# Patient Record
Sex: Female | Born: 1950 | Race: White | Hispanic: No | Marital: Married | Smoking: Never smoker
Health system: Southern US, Community
[De-identification: ages and names within clinical notes are randomized; demographics above are authoritative.]

## PROBLEM LIST (undated history)

## (undated) DIAGNOSIS — I10 Essential (primary) hypertension: Secondary | ICD-10-CM

## (undated) DIAGNOSIS — C4491 Basal cell carcinoma of skin, unspecified: Secondary | ICD-10-CM

## (undated) DIAGNOSIS — D122 Benign neoplasm of ascending colon: Secondary | ICD-10-CM

## (undated) DIAGNOSIS — Z8489 Family history of other specified conditions: Secondary | ICD-10-CM

## (undated) DIAGNOSIS — E7800 Pure hypercholesterolemia, unspecified: Secondary | ICD-10-CM

## (undated) DIAGNOSIS — R112 Nausea with vomiting, unspecified: Secondary | ICD-10-CM

## (undated) DIAGNOSIS — K219 Gastro-esophageal reflux disease without esophagitis: Secondary | ICD-10-CM

## (undated) DIAGNOSIS — R42 Dizziness and giddiness: Secondary | ICD-10-CM

## (undated) DIAGNOSIS — C4492 Squamous cell carcinoma of skin, unspecified: Secondary | ICD-10-CM

## (undated) DIAGNOSIS — M8580 Other specified disorders of bone density and structure, unspecified site: Secondary | ICD-10-CM

## (undated) DIAGNOSIS — Z9889 Other specified postprocedural states: Secondary | ICD-10-CM

## (undated) DIAGNOSIS — M1990 Unspecified osteoarthritis, unspecified site: Secondary | ICD-10-CM

## (undated) DIAGNOSIS — C801 Malignant (primary) neoplasm, unspecified: Secondary | ICD-10-CM

## (undated) HISTORY — PX: OTHER SURGICAL HISTORY: SHX169

## (undated) HISTORY — PX: BREAST CYST ASPIRATION: SHX578

## (undated) HISTORY — PX: ABDOMINAL HYSTERECTOMY: SHX81

---
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
## 2012-04-11 ENCOUNTER — Ambulatory Visit: Payer: Self-pay

## 2012-04-11 LAB — COMPREHENSIVE METABOLIC PANEL
Albumin: 4 g/dL (ref 3.4–5.0)
Alkaline Phosphatase: 111 U/L (ref 50–136)
Anion Gap: 9 (ref 7–16)
BUN: 27 mg/dL — ABNORMAL HIGH (ref 7–18)
Bilirubin,Total: 0.3 mg/dL (ref 0.2–1.0)
Calcium, Total: 9.5 mg/dL (ref 8.5–10.1)
Chloride: 103 mmol/L (ref 98–107)
Co2: 29 mmol/L (ref 21–32)
Creatinine: 0.99 mg/dL (ref 0.60–1.30)
EGFR (African American): >60
EGFR (Non-African Amer.): >60
Glucose: 106 mg/dL — ABNORMAL HIGH (ref 65–99)
Osmolality: 287 (ref 275–301)
Potassium: 4.8 mmol/L (ref 3.5–5.1)
SGOT(AST): 17 U/L (ref 15–37)
SGPT (ALT): 29 U/L (ref 12–78)
Sodium: 141 mmol/L (ref 136–145)
Total Protein: 7 g/dL (ref 6.4–8.2)

## 2012-04-11 LAB — CBC WITH DIFFERENTIAL/PLATELET
Basophil #: 0 10*3/uL (ref 0.0–0.1)
Basophil %: 0.6 %
Eosinophil #: 0.3 10*3/uL (ref 0.0–0.7)
Eosinophil %: 4.6 %
HCT: 37 % (ref 35.0–47.0)
HGB: 12 g/dL (ref 12.0–16.0)
Lymphocyte #: 2.1 10*3/uL (ref 1.0–3.6)
Lymphocyte %: 31.9 %
MCH: 27.6 pg (ref 26.0–34.0)
MCHC: 32.5 g/dL (ref 32.0–36.0)
MCV: 85 fL (ref 80–100)
Monocyte #: 0.4 x10 3/mm (ref 0.2–0.9)
Monocyte %: 6.7 %
Neutrophil #: 3.7 10*3/uL (ref 1.4–6.5)
Neutrophil %: 56.2 %
Platelet: 248 10*3/uL (ref 150–440)
RBC: 4.36 10*6/uL (ref 3.80–5.20)
RDW: 14.8 % — ABNORMAL HIGH (ref 11.5–14.5)
WBC: 6.5 10*3/uL (ref 3.6–11.0)

## 2012-04-11 LAB — LIPID PANEL
Cholesterol: 204 mg/dL — ABNORMAL HIGH (ref 0–200)
HDL Cholesterol: 49 mg/dL (ref 40–60)
Ldl Cholesterol, Calc: 130 mg/dL — ABNORMAL HIGH (ref 0–100)
Triglycerides: 125 mg/dL (ref 0–200)
VLDL Cholesterol, Calc: 25 mg/dL (ref 5–40)

## 2012-04-11 LAB — TSH: Thyroid Stimulating Horm: 2.02 u[IU]/mL

## 2014-01-05 DIAGNOSIS — K219 Gastro-esophageal reflux disease without esophagitis: Secondary | ICD-10-CM | POA: Insufficient documentation

## 2014-01-05 DIAGNOSIS — I10 Essential (primary) hypertension: Secondary | ICD-10-CM | POA: Insufficient documentation

## 2014-01-05 DIAGNOSIS — E7800 Pure hypercholesterolemia, unspecified: Secondary | ICD-10-CM | POA: Insufficient documentation

## 2015-01-12 ENCOUNTER — Other Ambulatory Visit: Payer: Self-pay | Admitting: Internal Medicine

## 2015-01-12 ENCOUNTER — Ambulatory Visit
Admission: RE | Admit: 2015-01-12 | Discharge: 2015-01-12 | Disposition: A | Discharge status: Home / Self Care | Payer: Medicare Other | Source: Ambulatory Visit | Attending: Internal Medicine | Admitting: Internal Medicine

## 2015-01-12 DIAGNOSIS — Z1231 Encounter for screening mammogram for malignant neoplasm of breast: Secondary | ICD-10-CM

## 2015-02-21 DIAGNOSIS — D122 Benign neoplasm of ascending colon: Secondary | ICD-10-CM

## 2015-02-21 DIAGNOSIS — M8580 Other specified disorders of bone density and structure, unspecified site: Secondary | ICD-10-CM | POA: Insufficient documentation

## 2015-02-21 HISTORY — DX: Benign neoplasm of ascending colon: D12.2

## 2015-02-24 ENCOUNTER — Other Ambulatory Visit: Payer: Self-pay | Admitting: Internal Medicine

## 2015-02-24 DIAGNOSIS — R319 Hematuria, unspecified: Secondary | ICD-10-CM

## 2015-02-24 DIAGNOSIS — R102 Pelvic and perineal pain: Secondary | ICD-10-CM

## 2015-03-01 ENCOUNTER — Ambulatory Visit: Admission: RE | Admit: 2015-03-01 | Payer: Medicare Other | Source: Ambulatory Visit

## 2015-03-01 ENCOUNTER — Ambulatory Visit
Admission: RE | Admit: 2015-03-01 | Discharge: 2015-03-01 | Disposition: A | Discharge status: Home / Self Care | Payer: Medicare Other | Source: Ambulatory Visit | Attending: Internal Medicine | Admitting: Internal Medicine

## 2015-03-01 DIAGNOSIS — Z90710 Acquired absence of both cervix and uterus: Secondary | ICD-10-CM | POA: Diagnosis not present

## 2015-03-01 DIAGNOSIS — R102 Pelvic and perineal pain: Secondary | ICD-10-CM

## 2015-03-01 DIAGNOSIS — R319 Hematuria, unspecified: Secondary | ICD-10-CM | POA: Insufficient documentation

## 2015-12-14 ENCOUNTER — Other Ambulatory Visit: Payer: Self-pay | Admitting: Internal Medicine

## 2015-12-14 DIAGNOSIS — Z1231 Encounter for screening mammogram for malignant neoplasm of breast: Secondary | ICD-10-CM

## 2016-01-16 ENCOUNTER — Ambulatory Visit: Payer: Medicare Other

## 2016-01-16 ENCOUNTER — Ambulatory Visit
Admission: RE | Admit: 2016-01-16 | Discharge: 2016-01-16 | Disposition: A | Discharge status: Home / Self Care | Payer: Medicare Other | Source: Ambulatory Visit | Attending: Internal Medicine | Admitting: Internal Medicine

## 2016-01-16 DIAGNOSIS — Z1231 Encounter for screening mammogram for malignant neoplasm of breast: Secondary | ICD-10-CM | POA: Diagnosis present

## 2016-01-16 HISTORY — DX: Malignant (primary) neoplasm, unspecified: C80.1

## 2016-07-02 ENCOUNTER — Encounter: Payer: Self-pay | Admitting: *Deleted

## 2016-07-03 ENCOUNTER — Ambulatory Visit
Admission: RE | Admit: 2016-07-03 | Discharge: 2016-07-03 | Disposition: A | Discharge status: Home / Self Care | Payer: Medicare HMO | Source: Ambulatory Visit | Attending: Gastroenterology | Admitting: Gastroenterology

## 2016-07-03 ENCOUNTER — Ambulatory Visit: Payer: Medicare HMO | Admitting: Anesthesiology

## 2016-07-03 ENCOUNTER — Encounter
Admission: RE | Disposition: A | Discharge status: Home / Self Care | Payer: Self-pay | Source: Ambulatory Visit | Attending: Gastroenterology

## 2016-07-03 ENCOUNTER — Encounter: Payer: Self-pay | Admitting: *Deleted

## 2016-07-03 DIAGNOSIS — K219 Gastro-esophageal reflux disease without esophagitis: Secondary | ICD-10-CM | POA: Diagnosis not present

## 2016-07-03 DIAGNOSIS — Z86010 Personal history of colonic polyps: Secondary | ICD-10-CM | POA: Diagnosis not present

## 2016-07-03 DIAGNOSIS — Z79899 Other long term (current) drug therapy: Secondary | ICD-10-CM | POA: Insufficient documentation

## 2016-07-03 DIAGNOSIS — Z85828 Personal history of other malignant neoplasm of skin: Secondary | ICD-10-CM | POA: Diagnosis not present

## 2016-07-03 DIAGNOSIS — Z7982 Long term (current) use of aspirin: Secondary | ICD-10-CM | POA: Diagnosis not present

## 2016-07-03 DIAGNOSIS — Z1211 Encounter for screening for malignant neoplasm of colon: Secondary | ICD-10-CM | POA: Insufficient documentation

## 2016-07-03 DIAGNOSIS — Z882 Allergy status to sulfonamides status: Secondary | ICD-10-CM | POA: Diagnosis not present

## 2016-07-03 DIAGNOSIS — I10 Essential (primary) hypertension: Secondary | ICD-10-CM | POA: Insufficient documentation

## 2016-07-03 DIAGNOSIS — K5730 Diverticulosis of large intestine without perforation or abscess without bleeding: Secondary | ICD-10-CM | POA: Insufficient documentation

## 2016-07-03 HISTORY — DX: Other specified disorders of bone density and structure, unspecified site: M85.80

## 2016-07-03 HISTORY — PX: COLONOSCOPY WITH PROPOFOL: SHX5780

## 2016-07-03 HISTORY — DX: Gastro-esophageal reflux disease without esophagitis: K21.9

## 2016-07-03 HISTORY — DX: Pure hypercholesterolemia, unspecified: E78.00

## 2016-07-03 HISTORY — DX: Essential (primary) hypertension: I10

## 2016-07-03 SURGERY — COLONOSCOPY WITH PROPOFOL
Anesthesia: General

## 2016-07-03 MED ORDER — PROPOFOL 10 MG/ML IV BOLUS
INTRAVENOUS | Status: DC | PRN
  Administered 2016-07-03: 30 mg via INTRAVENOUS
  Administered 2016-07-03: 50 mg via INTRAVENOUS

## 2016-07-03 MED ORDER — SODIUM CHLORIDE 0.9 % IV SOLN
INTRAVENOUS | Status: DC
  Administered 2016-07-03: 1000 mL via INTRAVENOUS

## 2016-07-03 MED ORDER — SODIUM CHLORIDE 0.9 % IV SOLN: INTRAVENOUS | Status: DC

## 2016-07-03 MED ORDER — PROPOFOL 500 MG/50ML IV EMUL
INTRAVENOUS | Status: AC
  Filled 2016-07-03: qty 50

## 2016-07-03 MED ORDER — PROPOFOL 500 MG/50ML IV EMUL
INTRAVENOUS | Status: DC | PRN
  Administered 2016-07-03: 150 ug/kg/min via INTRAVENOUS

## 2016-07-03 NOTE — Anesthesia Postprocedure Evaluation (Signed)
Anesthesia Post Note  Patient: Tabitha Gonzales  Procedure(s) Performed: Procedure(s) (LRB): COLONOSCOPY WITH PROPOFOL (N/A)  Patient location during evaluation: Endoscopy Anesthesia Type: General Level of consciousness: awake and alert Pain management: pain level controlled Vital Signs Assessment: post-procedure vital signs reviewed and stable Respiratory status: spontaneous breathing and respiratory function stable Cardiovascular status: stable Anesthetic complications: no     Last Vitals:  Vitals:   07/03/16 1204 07/03/16 1214  BP: 104/63 103/79  Pulse: (!) 57 65  Resp: 15 14  Temp: 36.1 C     Last Pain:  Vitals:   07/03/16 1204  TempSrc: Tympanic                 KEPHART,WILLIAM K

## 2016-07-03 NOTE — H&P (Signed)
Outpatient short stay form Pre-procedure 07/03/2016 11:16 AM Tabitha Sails MD  Primary Physician: Dr. Fulton Reek  Reason for visit:  Colonoscopy  History of present illness:  Patient is a 66 year old female presenting as above. She has personal history of adenomatous colon polyps. She tolerated her prep well. She takes no blood thinning agents with the exception of a 81 mg aspirin that has been held for several days. Her last colonoscopy was 05/21/2013 with a 1 cm adenoma. She does have reflux symptoms that are well-controlled on Nexium daily.    Current Facility-Administered Medications:  .  0.9 %  sodium chloride infusion, , Intravenous, Continuous, Tabitha Sails, MD, Last Rate: 20 mL/hr at 07/03/16 1059, 1,000 mL at 07/03/16 1059 .  0.9 %  sodium chloride infusion, , Intravenous, Continuous, Tabitha Sails, MD  Prescriptions Prior to Admission  Medication Sig Dispense Refill Last Dose  . aspirin EC 81 MG tablet Take 81 mg by mouth daily.     Marland Kitchen esomeprazole (NEXIUM) 40 MG capsule Take 40 mg by mouth daily at 12 noon.     . fluticasone (FLONASE) 50 MCG/ACT nasal spray Place 1 spray into both nostrils daily.     . Multiple Vitamin (MULTIVITAMIN) tablet Take 1 tablet by mouth daily.     . polyethylene glycol powder (GLYCOLAX/MIRALAX) powder Take 1 Container by mouth once.     . [DISCONTINUED] Biotin 1000 MCG tablet Take 1,000 mcg by mouth 3 (three) times daily.     . [DISCONTINUED] estradiol (VIVELLE-DOT) 0.05 MG/24HR patch Place 1 patch onto the skin 2 (two) times a week.        Allergies  Allergen Reactions  . Sulfa Antibiotics Itching     Past Medical History:  Diagnosis Date  . Adenomatous polyp of ascending colon   . Cancer ([place])    skin ca  . GERD (gastroesophageal reflux disease)   . Hypercholesteremia   . Hypertension   . Osteopenia     Review of systems:      Physical Exam    Heart and lungs: Regular rate and rhythm without rub or gallop, lungs  are bilaterally clear.    HEENT: Normocephalic atraumatic eyes are anicteric    Other:     Pertinant exam for procedure: Soft nontender nondistended bowel sounds positive normoactive.    Planned proceedures: Colonoscopy Indicated procedures. I have discussed the risks benefits and complications of procedures to include not limited to bleeding, infection, perforation and the risk of sedation and the patient wishes to proceed.    Tabitha Sails, MD Gastroenterology 07/03/2016  11:16 AM

## 2016-07-03 NOTE — Transfer of Care (Signed)
Immediate Anesthesia Transfer of Care Note  Patient: Tabitha Gonzales  Procedure(s) Performed: Procedure(s): COLONOSCOPY WITH PROPOFOL (N/A)  Patient Location: PACU  Anesthesia Type:General  Level of Consciousness: sedated  Airway & Oxygen Therapy: Patient Spontanous Breathing and Patient connected to nasal cannula oxygen  Post-op Assessment: Report given to RN and Post -op Vital signs reviewed and stable  Post vital signs: Reviewed and stable  Last Vitals:  Vitals:   07/03/16 1035 07/03/16 1204  BP: (!) 144/86 104/63  Pulse: 75 (!) 57  Resp: 18 15  Temp: 36.3 C 36.1 C    Last Pain:  Vitals:   07/03/16 1204  TempSrc: Tympanic         Complications: No apparent anesthesia complications

## 2016-07-03 NOTE — Anesthesia Procedure Notes (Signed)
Date/Time: 07/03/2016 11:22 AM Performed by: Nelda Marseille Pre-anesthesia Checklist: Patient identified, Emergency Drugs available, Suction available, Patient being monitored and Timeout performed Oxygen Delivery Method: Nasal cannula

## 2016-07-03 NOTE — Anesthesia Post-op Follow-up Note (Cosign Needed)
Anesthesia QCDR form completed.        

## 2016-07-03 NOTE — Op Note (Signed)
[facility] Gastroenterology Patient Name: Tabitha Gonzales Procedure Date: 07/03/2016 11:08 AM MRN: [id number] Account #: [id number] Date of Birth: 1950/09/07 Admit Type: Outpatient Age: 66 Room: [facility] ENDO ROOM 3 Gender: Female Note Status: Finalized Procedure:            Colonoscopy Indications:          Personal history of colonic polyps Providers:            Lollie Sails, MD Referring MD:         Leonie Douglas. Doy Hutching, MD (Referring MD) Medicines:            Monitored Anesthesia Care Complications:        No immediate complications. Procedure:            Pre-Anesthesia Assessment:                       - ASA Grade Assessment: II - A patient with mild                        systemic disease.                       After obtaining informed consent, the colonoscope was                        passed under direct vision. Throughout the procedure,                        the patient's blood pressure, pulse, and oxygen                        saturations were monitored continuously. The                        Colonoscope was introduced through the anus and                        advanced to the the cecum, identified by appendiceal                        orifice and ileocecal valve. The colonoscopy was                        performed with moderate difficulty due to multiple                        diverticula in the colon and a tortuous colon.                        Successful completion of the procedure was aided by                        changing the patient to a supine position, changing the                        patient to a prone position, using manual pressure and                        withdrawing the scope and replacing with the pediatric  colonoscope. The patient tolerated the procedure well.                        The quality of the bowel preparation was good. Findings:      Many small and large-mouthed diverticula were found in the  sigmoid       colon, descending colon, transverse colon and ascending colon.      The digital rectal exam was normal. Impression:           - Diverticulosis in the sigmoid colon, in the                        descending colon, in the transverse colon and in the                        ascending colon.                       - No specimens collected. Recommendation:       - Discharge patient to home.                       - Repeat colonoscopy in 5 years for screening purposes. Procedure Code(s):    --- Professional ---                       [phone], Colonoscopy, flexible; diagnostic, including                        collection of specimen(s) by brushing or washing, when                        performed (separate procedure) Diagnosis Code(s):    --- Professional ---                       Z86.010, Personal history of colonic polyps                       K57.30, Diverticulosis of large intestine without                        perforation or abscess without bleeding CPT copyright 2016 American Medical Association. All rights reserved. The codes documented in this report are preliminary and upon coder review may  be revised to meet current compliance requirements. Lollie Sails, MD 07/03/2016 12:02:59 PM This report has been signed electronically. Number of Addenda: 0 Note Initiated On: 07/03/2016 11:08 AM Scope Withdrawal Time: 0 hours 7 minutes 33 seconds  Total Procedure Duration: 0 hours 23 minutes 25 seconds       [facility]

## 2016-07-03 NOTE — Anesthesia Preprocedure Evaluation (Signed)
Anesthesia Evaluation  Patient identified by MRN, date of birth, ID band Patient awake    Reviewed: Allergy & Precautions, NPO status , Patient's Chart, lab work & pertinent test results  History of Anesthesia Complications (+) PONVNegative for: history of anesthetic complications  Airway Mallampati: II       Dental   Pulmonary neg pulmonary ROS,           Cardiovascular      Neuro/Psych negative neurological ROS     GI/Hepatic Neg liver ROS, GERD  Medicated and Controlled,  Endo/Other  negative endocrine ROS  Renal/GU negative Renal ROS     Musculoskeletal   Abdominal   Peds  Hematology   Anesthesia Other Findings   Reproductive/Obstetrics                             Anesthesia Physical Anesthesia Plan  ASA: II  Anesthesia Plan: General   Post-op Pain Management:    Induction: Intravenous  PONV Risk Score and Plan: 4 or greater and Ondansetron, Dexamethasone, Propofol, Midazolam, Scopolamine patch - Pre-op and Treatment may vary due to age  Airway Management Planned:   Additional Equipment:   Intra-op Plan:   Post-operative Plan:   Informed Consent: I have reviewed the patients History and Physical, chart, labs and discussed the procedure including the risks, benefits and alternatives for the proposed anesthesia with the patient or authorized representative who has indicated his/her understanding and acceptance.     Plan Discussed with:   Anesthesia Plan Comments:         Anesthesia Quick Evaluation

## 2016-07-04 ENCOUNTER — Encounter: Payer: Self-pay | Admitting: Gastroenterology

## 2016-09-17 ENCOUNTER — Other Ambulatory Visit: Payer: Self-pay | Admitting: Internal Medicine

## 2016-09-17 DIAGNOSIS — R1011 Right upper quadrant pain: Secondary | ICD-10-CM

## 2016-09-21 ENCOUNTER — Ambulatory Visit
Admission: RE | Admit: 2016-09-21 | Discharge: 2016-09-21 | Disposition: A | Discharge status: Home / Self Care | Payer: Medicare HMO | Source: Ambulatory Visit | Attending: Internal Medicine | Admitting: Internal Medicine

## 2016-09-21 DIAGNOSIS — R1011 Right upper quadrant pain: Secondary | ICD-10-CM

## 2017-01-02 ENCOUNTER — Other Ambulatory Visit: Payer: Self-pay | Admitting: Internal Medicine

## 2017-01-02 DIAGNOSIS — Z1231 Encounter for screening mammogram for malignant neoplasm of breast: Secondary | ICD-10-CM

## 2017-01-21 IMAGING — MG MM DIGITAL SCREENING BILAT W/ TOMO W/ CAD
8 of 12 series · 8 of 28 positions shown · non-contrast
Comparison: None.

CLINICAL DATA: Screening.

EXAM:
DIGITAL SCREENING BILATERAL MAMMOGRAM WITH 3D TOMO WITH CAD

[L CC]
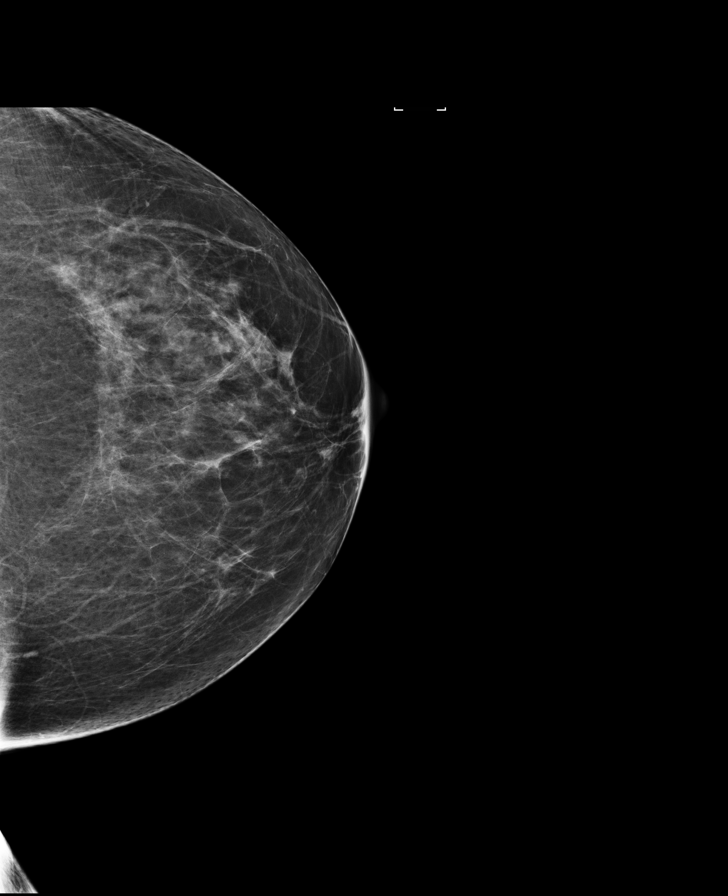

[L CC synth-2D]
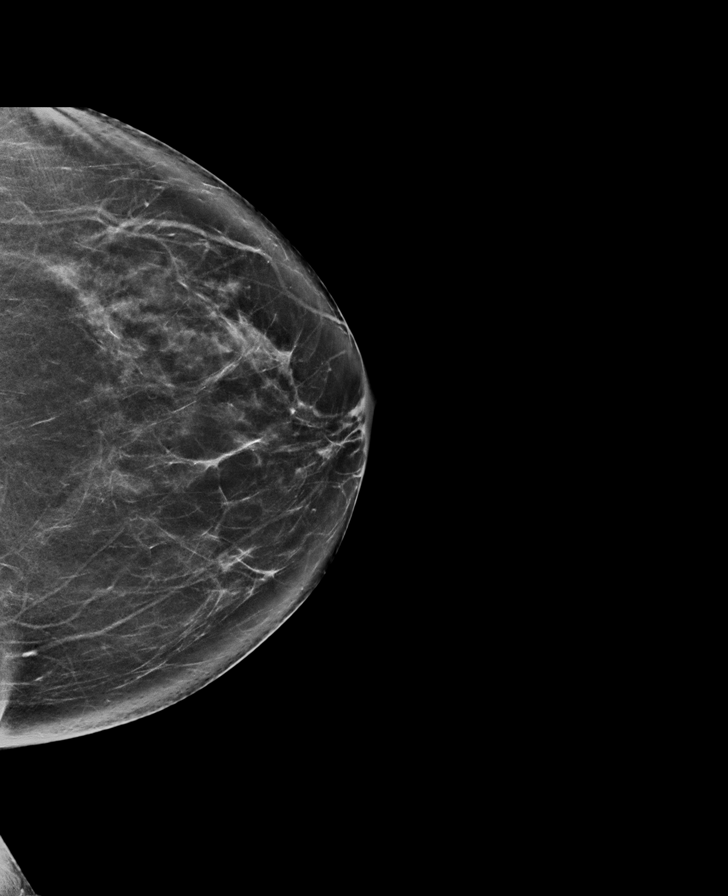

[R CC]
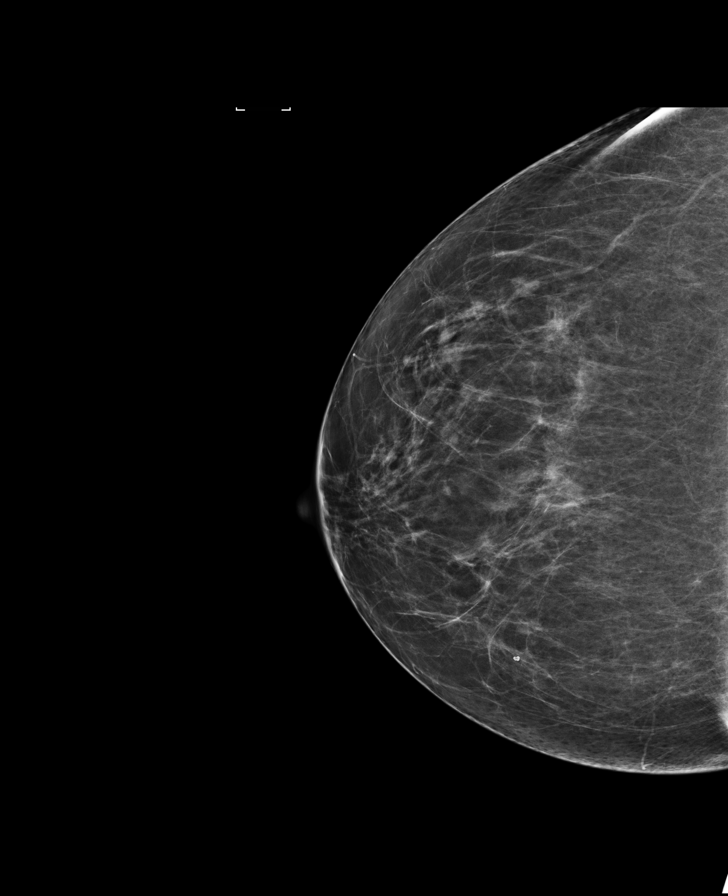

[R CC synth-2D]
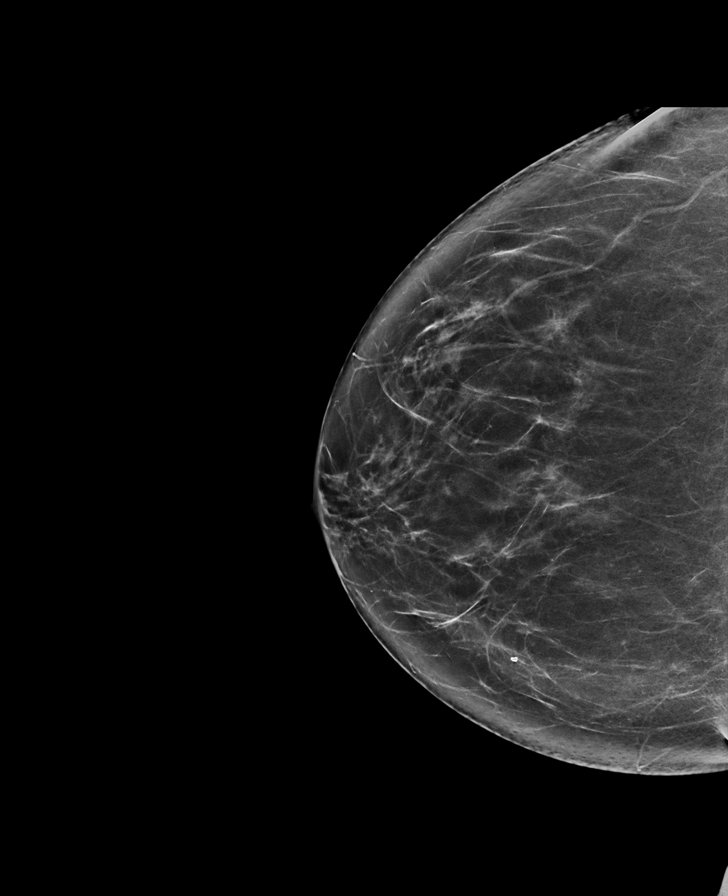

[L MLO]
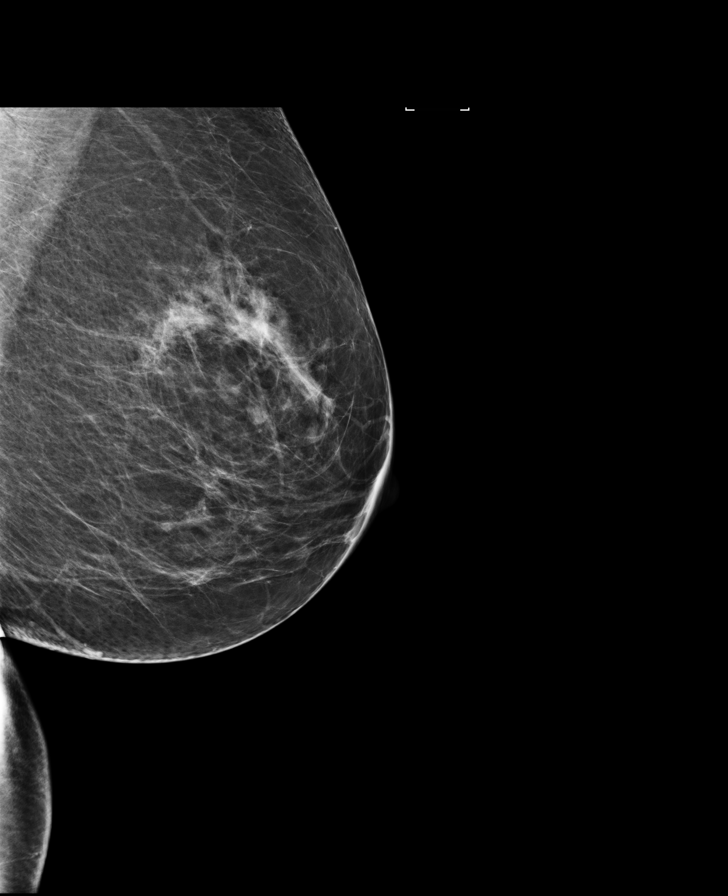

[R MLO synth-2D]
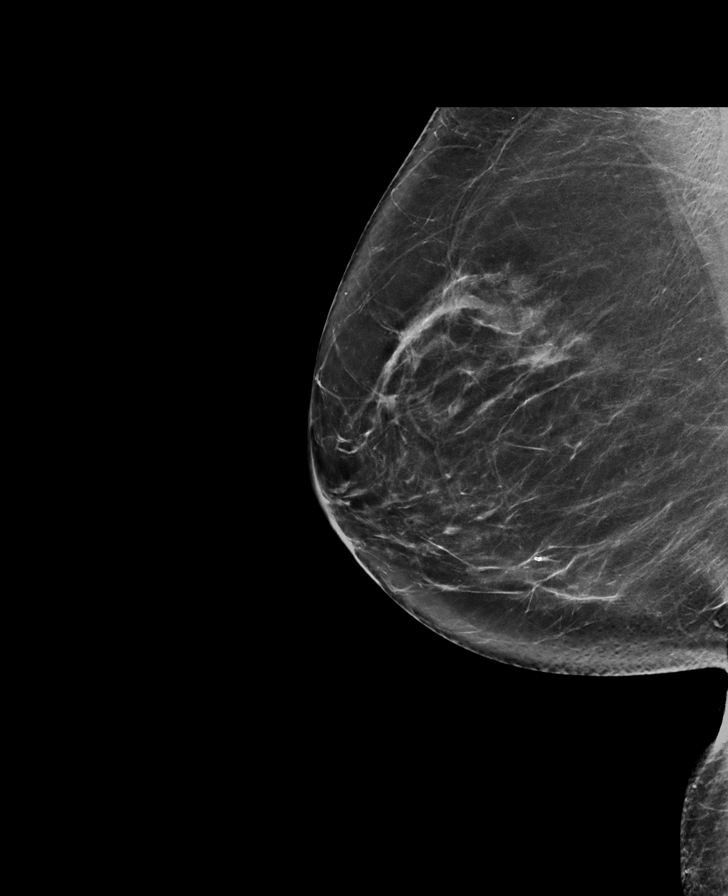

[L MLO synth-2D]
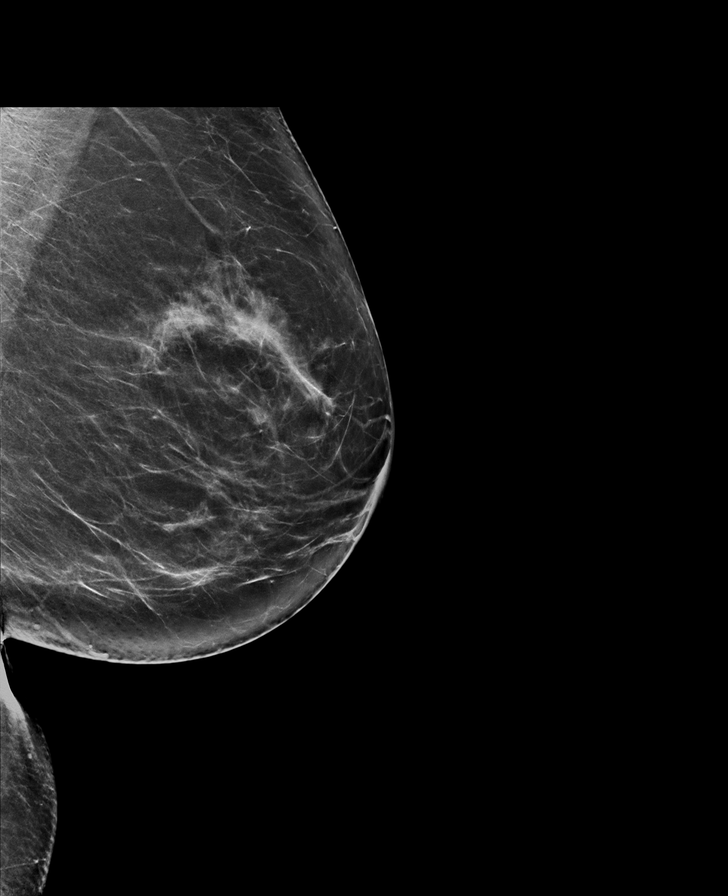

[R MLO]
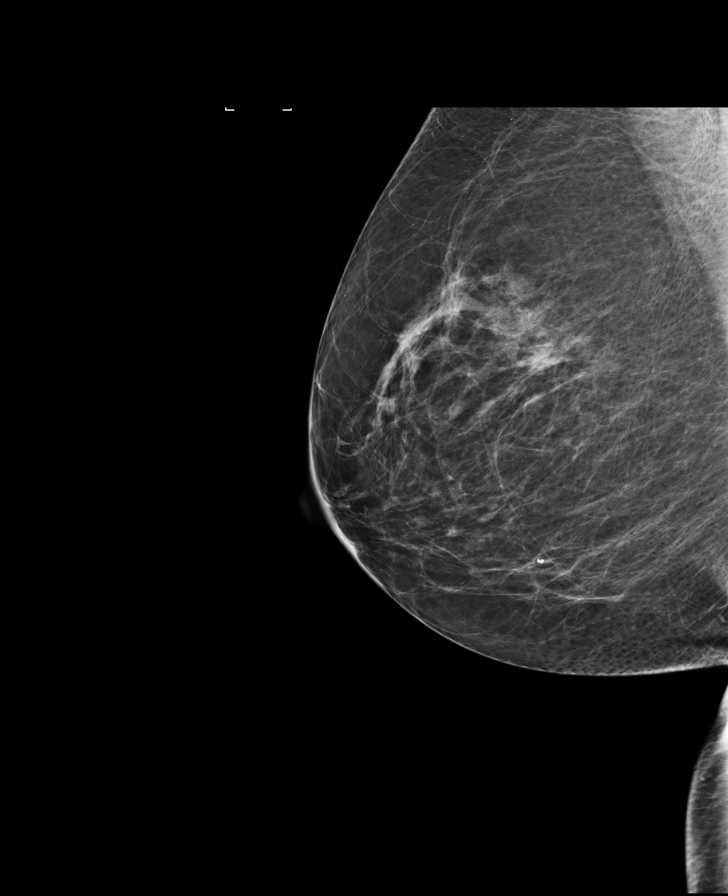

[8 of 28 positions shown; findings below may reference images not displayed]

ACR Breast Density Category b: There are scattered areas of
fibroglandular density.
FINDINGS: There are no findings suspicious for malignancy. Images were
processed with CAD.
IMPRESSION: No mammographic evidence of malignancy. A result letter of this
screening mammogram will be mailed directly to the patient.

RECOMMENDATION:
Screening mammogram in one year. (Code:2K-Q-4CQ)

BI-RADS CATEGORY  1: Negative.

## 2017-01-23 ENCOUNTER — Encounter (INDEPENDENT_AMBULATORY_CARE_PROVIDER_SITE_OTHER): Payer: Self-pay

## 2017-01-23 ENCOUNTER — Ambulatory Visit
Admission: RE | Admit: 2017-01-23 | Discharge: 2017-01-23 | Disposition: A | Discharge status: Home / Self Care | Payer: Medicare HMO | Source: Ambulatory Visit | Attending: Internal Medicine | Admitting: Internal Medicine

## 2017-01-23 DIAGNOSIS — Z1231 Encounter for screening mammogram for malignant neoplasm of breast: Secondary | ICD-10-CM | POA: Insufficient documentation

## 2017-05-09 ENCOUNTER — Other Ambulatory Visit: Payer: Self-pay | Admitting: Gastroenterology

## 2017-05-09 DIAGNOSIS — R1011 Right upper quadrant pain: Secondary | ICD-10-CM

## 2017-05-13 ENCOUNTER — Ambulatory Visit
Admission: RE | Admit: 2017-05-13 | Discharge: 2017-05-13 | Disposition: A | Discharge status: Home / Self Care | Payer: Medicare HMO | Source: Ambulatory Visit | Attending: Gastroenterology | Admitting: Gastroenterology

## 2017-05-13 DIAGNOSIS — K219 Gastro-esophageal reflux disease without esophagitis: Secondary | ICD-10-CM | POA: Insufficient documentation

## 2017-05-13 DIAGNOSIS — R1011 Right upper quadrant pain: Secondary | ICD-10-CM | POA: Diagnosis not present

## 2017-10-10 ENCOUNTER — Encounter (INDEPENDENT_AMBULATORY_CARE_PROVIDER_SITE_OTHER): Payer: Self-pay | Admitting: Vascular Surgery

## 2017-10-10 ENCOUNTER — Ambulatory Visit (INDEPENDENT_AMBULATORY_CARE_PROVIDER_SITE_OTHER): Payer: Medicare HMO | Admitting: Vascular Surgery

## 2017-10-10 VITALS — BP 148/97 | HR 81 | Resp 14 | Ht 63.0 in | Wt 176.0 lb

## 2017-10-10 DIAGNOSIS — R600 Localized edema: Secondary | ICD-10-CM

## 2017-10-10 DIAGNOSIS — M79605 Pain in left leg: Secondary | ICD-10-CM

## 2017-10-10 DIAGNOSIS — M79604 Pain in right leg: Secondary | ICD-10-CM | POA: Diagnosis not present

## 2017-10-10 DIAGNOSIS — I83813 Varicose veins of bilateral lower extremities with pain: Secondary | ICD-10-CM | POA: Diagnosis not present

## 2017-10-10 NOTE — Progress Notes (Signed)
Subjective:    Patient ID: Tabitha Gonzales, female    DOB: [birth year], 67 y.o.   MRN: [id number] Chief Complaint  Patient presents with  . New Patient (Initial Visit)    Varicose Veins   Presents as a new patient referred by Dr. Doy Hutching for evaluation of painful varicose veins and lower extremity edema.  The patient notes long-standing history of varicosities located to the bilateral legs.  The patient notes that over the last year, she has a experienced an increase in edema to her lower extremities.  The patient notes that her varicose vein discomfort and edema worsens with sitting and standing for long periods of time.  The patient also notes that her symptoms worsen towards the end of the day.  At this time, the patient does not engage in conservative therapy including wearing medical grade 1 compression socks, elevating her legs and remaining active on a daily basis.  The patient notes that her discomfort and edema have progressively worsened to the point that she sought medical attention.  The patient denies any rest pain or ulcer formation to the bilateral lower extremity.  The patient does experience bilateral calf cramping and intermittent basis with activity.  The patient also experiences tingling and burning to her calves.  The patient denies any history of recent surgery or trauma or DVT history.  The patient denies any recent bouts of recurrent cellulitis.  The patient denies any fever, nausea vomiting.  Review of Systems  Constitutional: Negative.   HENT: Negative.   Eyes: Negative.   Respiratory: Negative.   Cardiovascular: Positive for leg swelling.       Painful varicose veins  Gastrointestinal: Negative.   Endocrine: Negative.   Genitourinary: Negative.   Musculoskeletal: Negative.   Skin: Negative.   Allergic/Immunologic: Negative.   Neurological: Negative.   Hematological: Negative.   Psychiatric/Behavioral: Negative.       Objective:   Physical Exam    Constitutional: She is oriented to person, place, and time. She appears well-developed and well-nourished. No distress.  HENT:  Head: Normocephalic and atraumatic.  Right Ear: External ear normal.  Left Ear: External ear normal.  Mouth/Throat: Oropharynx is clear and moist.  Eyes: Pupils are equal, round, and reactive to light. Conjunctivae and EOM are normal.  Neck: Normal range of motion.  Cardiovascular: Normal rate, regular rhythm, normal heart sounds and intact distal pulses.  Pulses:      Radial pulses are 2+ on the right side, and 2+ on the left side.  Hard to palpate pedal pulses due to body habitus and edema however the bilateral feet are warm and there is a good capillary refill.  Pulmonary/Chest: Effort normal and breath sounds normal.  Musculoskeletal: Normal range of motion. She exhibits edema (Mild to moderate 1+ pitting edema noted bilaterally).  Neurological: She is alert and oriented to person, place, and time.  Skin: Skin is warm and dry. She is not diaphoretic.  Diffuse greater than 1 cm less than 1 cm varicosities noted to the bilateral lower extremity.  Mild stasis dermatitis.  There is no fibrosis, cellulitis or active ulcerations at this time.  Psychiatric: She has a normal mood and affect. Her behavior is normal. Judgment and thought content normal.  Vitals reviewed.  BP (!) 148/97 (BP Location: Right Arm, Patient Position: Sitting)   Pulse 81   Resp 14   Ht 5\' 3"  (1.6 m)   Wt 176 lb (79.8 kg)   BMI 31.18 kg/m   Past Medical  History:  Diagnosis Date  . Adenomatous polyp of ascending colon   . Cancer ([place])    skin ca  . GERD (gastroesophageal reflux disease)   . Hypercholesteremia   . Hypertension   . Osteopenia    Social History   Socioeconomic History  . Marital status: Married    Spouse name: Not on file  . Number of children: Not on file  . Years of education: Not on file  . Highest education level: Not on file  Occupational History  . Not  on file  Social Needs  . Financial resource strain: Not on file  . Food insecurity:    Worry: Not on file    Inability: Not on file  . Transportation needs:    Medical: Not on file    Non-medical: Not on file  Tobacco Use  . Smoking status: Never Smoker  . Smokeless tobacco: Never Used  Substance and Sexual Activity  . Alcohol use: No  . Drug use: No  . Sexual activity: Not on file  Lifestyle  . Physical activity:    Days per week: Not on file    Minutes per session: Not on file  . Stress: Not on file  Relationships  . Social connections:    Talks on phone: Not on file    Gets together: Not on file    Attends religious service: Not on file    Active member of club or organization: Not on file    Attends meetings of clubs or organizations: Not on file    Relationship status: Not on file  . Intimate partner violence:    Fear of current or ex partner: Not on file    Emotionally abused: Not on file    Physically abused: Not on file    Forced sexual activity: Not on file  Other Topics Concern  . Not on file  Social History Narrative  . Not on file   Past Surgical History:  Procedure Laterality Date  . ABDOMINAL HYSTERECTOMY    . BREAST CYST ASPIRATION Right 20 years ago   neg  . COLONOSCOPY WITH PROPOFOL N/A 07/03/2016   Procedure: COLONOSCOPY WITH PROPOFOL;  Surgeon: Lollie Sails, MD;  Location[facility] ENDOSCOPY;  Service: Endoscopy;  Laterality: N/A;  . right shoulder rotater cuff     Family History  Problem Relation Age of Onset  . Breast cancer Cousin        pat cousin   Allergies  Allergen Reactions  . Sulfa Antibiotics Itching      Assessment & Plan:  Presents as a new patient referred by Dr. Doy Hutching for evaluation of painful varicose veins and lower extremity edema.  The patient notes long-standing history of varicosities located to the bilateral legs.  The patient notes that over the last year, she has a experienced an increase in edema to her lower  extremities.  The patient notes that her varicose vein discomfort and edema worsens with sitting and standing for long periods of time.  The patient also notes that her symptoms worsen towards the end of the day.  At this time, the patient does not engage in conservative therapy including wearing medical grade 1 compression socks, elevating her legs and remaining active on a daily basis.  The patient notes that her discomfort and edema have progressively worsened to the point that she sought medical attention.  The patient denies any rest pain or ulcer formation to the bilateral lower extremity.  The patient does experience bilateral  calf cramping and intermittent basis with activity.  The patient also experiences tingling and burning to her calves.  The patient denies any history of recent surgery or trauma or DVT history.  The patient denies any recent bouts of recurrent cellulitis.  The patient denies any fever, nausea vomiting.  1. Varicose veins of both lower extremities with pain - New The patient was encouraged to wear graduated compression stockings (20-30 mmHg) on a daily basis. The patient was instructed to begin wearing the stockings first thing in the morning and removing them in the evening. The patient was instructed specifically not to sleep in the stockings. Prescription given.  In addition, behavioral modification including elevation during the day will be initiated. Anti-inflammatories for pain. I will bring the patient back and have her undergo bilateral venous duplex to rule out any contributing venous versus lymphatic disease.  The patient will follow up in three months to asses conservative management.  Information on chronic venous insufficiency and compression stockings was given to the patient. The patient was instructed to call the office in the interim if any worsening edema or ulcerations to the legs, feet or toes occurs. The patient expresses their understanding.  - VAS [place]  LOWER EXTREMITY VENOUS REFLUX; Future  2. Bilateral lower extremity edema - New As above  - VAS [place] LOWER EXTREMITY VENOUS REFLUX; Future  3. Lower extremity pain, bilateral - New Patient with symptoms consistent with claudication. Faint pedal pulses on exam Bring the patient back and have her undergo an ABI to assess for any contributing peripheral artery disease I have discussed with the patient at length the risk factors for and pathogenesis of atherosclerotic disease and encouraged a healthy diet, regular exercise regimen and blood pressure / glucose control.  The patient was encouraged to call the office in the interim if he experiences any claudication like symptoms, rest pain or ulcers to his feet / toes.  - VAS [place] ABI WITH/WO TBI; Future  Current Outpatient Medications on File Prior to Visit  Medication Sig Dispense Refill  . aspirin EC 81 MG tablet Take 81 mg by mouth daily.    Marland Kitchen esomeprazole (NEXIUM) 40 MG capsule Take 40 mg by mouth daily at 12 noon.    . fluticasone (FLONASE) 50 MCG/ACT nasal spray Place 1 spray into both nostrils daily.    . Multiple Vitamin (MULTIVITAMIN) tablet Take 1 tablet by mouth daily.    . polyethylene glycol powder (GLYCOLAX/MIRALAX) powder Take 1 Container by mouth once.     No current facility-administered medications on file prior to visit.    There are no Patient Instructions on file for this visit. No follow-ups on file.  Kimela Malstrom A Tawona Filsinger, PA-C

## 2017-11-26 ENCOUNTER — Other Ambulatory Visit: Payer: Self-pay | Admitting: Internal Medicine

## 2017-11-26 DIAGNOSIS — Z1231 Encounter for screening mammogram for malignant neoplasm of breast: Secondary | ICD-10-CM

## 2018-01-14 ENCOUNTER — Encounter (INDEPENDENT_AMBULATORY_CARE_PROVIDER_SITE_OTHER): Payer: Medicare HMO

## 2018-01-14 ENCOUNTER — Ambulatory Visit (INDEPENDENT_AMBULATORY_CARE_PROVIDER_SITE_OTHER): Payer: Medicare HMO | Admitting: Vascular Surgery

## 2018-01-27 ENCOUNTER — Ambulatory Visit
Admission: RE | Admit: 2018-01-27 | Discharge: 2018-01-27 | Disposition: A | Discharge status: Home / Self Care | Payer: Medicare HMO | Source: Ambulatory Visit | Attending: Internal Medicine | Admitting: Internal Medicine

## 2018-01-27 DIAGNOSIS — Z1231 Encounter for screening mammogram for malignant neoplasm of breast: Secondary | ICD-10-CM | POA: Diagnosis not present

## 2018-02-11 ENCOUNTER — Ambulatory Visit (INDEPENDENT_AMBULATORY_CARE_PROVIDER_SITE_OTHER): Payer: Medicare HMO

## 2018-02-11 ENCOUNTER — Encounter (INDEPENDENT_AMBULATORY_CARE_PROVIDER_SITE_OTHER): Payer: Self-pay | Admitting: Vascular Surgery

## 2018-02-11 ENCOUNTER — Ambulatory Visit (INDEPENDENT_AMBULATORY_CARE_PROVIDER_SITE_OTHER): Payer: Medicare HMO | Admitting: Vascular Surgery

## 2018-02-11 VITALS — BP 160/96 | HR 66 | Resp 14 | Ht 63.0 in | Wt 181.2 lb

## 2018-02-11 DIAGNOSIS — R600 Localized edema: Secondary | ICD-10-CM | POA: Diagnosis not present

## 2018-02-11 DIAGNOSIS — I8312 Varicose veins of left lower extremity with inflammation: Secondary | ICD-10-CM | POA: Diagnosis not present

## 2018-02-11 DIAGNOSIS — M79609 Pain in unspecified limb: Secondary | ICD-10-CM | POA: Insufficient documentation

## 2018-02-11 DIAGNOSIS — M79605 Pain in left leg: Secondary | ICD-10-CM

## 2018-02-11 DIAGNOSIS — M79604 Pain in right leg: Secondary | ICD-10-CM

## 2018-02-11 DIAGNOSIS — I83813 Varicose veins of bilateral lower extremities with pain: Secondary | ICD-10-CM

## 2018-02-11 NOTE — Progress Notes (Signed)
MRN : [id number]  Tabitha Gonzales is a 68 y.o. (Jun 26, 1950) female who presents with chief complaint of  Chief Complaint  Patient presents with  . Follow-up  .  History of Present Illness: Patient returns today in follow up of leg pain and swelling.  Her left lower leg and ankle continues to swell even with the use of compression stockings and elevating her legs.  Her legs are little tired and heavy but all in all the compression stockings have helped a little bit and they are feeling a little bit better today than they were at her first visit a few months ago.  Her noninvasive studies today showed normal ABIs and waveforms bilaterally consistent with no arterial insufficiency.  Her venous duplex shows left great saphenous vein reflux throughout the left leg as well as some deep venous reflux bilaterally.  No DVT or superficial thrombophlebitis were identified.  Current Outpatient Medications  Medication Sig Dispense Refill  . aspirin EC 81 MG tablet Take 81 mg by mouth daily.    Marland Kitchen esomeprazole (NEXIUM) 40 MG capsule Take 40 mg by mouth daily at 12 noon.    . fluticasone (FLONASE) 50 MCG/ACT nasal spray Place 1 spray into both nostrils daily.    . Multiple Vitamin (MULTIVITAMIN) tablet Take 1 tablet by mouth daily.     No current facility-administered medications for this visit.     Past Medical History:  Diagnosis Date  . Cancer ([place])    skin ca  . GERD (gastroesophageal reflux disease)   . Hypercholesteremia   . Hypertension   . Osteopenia     Past Surgical History:  Procedure Laterality Date  . ABDOMINAL HYSTERECTOMY    . BREAST CYST ASPIRATION Right 20 years ago   neg  . COLONOSCOPY WITH PROPOFOL N/A 07/03/2016   Procedure: COLONOSCOPY WITH PROPOFOL;  Surgeon: Lollie Sails, MD;  Location: [facility] ENDOSCOPY;  Service: Endoscopy;  Laterality: N/A;  . right shoulder rotater cuff      Social History Social History   Tobacco Use  . Smoking status: Never Smoker  .  Smokeless tobacco: Never Used  Substance Use Topics  . Alcohol use: No  . Drug use: No      Family History Family History  Problem Relation Age of Onset  . Breast cancer Cousin        pat cousin    Allergies  Allergen Reactions  . Sulfa Antibiotics Itching     REVIEW OF SYSTEMS (Negative unless checked)  Constitutional: [] Weight loss  [] Fever  [] Chills Cardiac: [] Chest pain   [] Chest pressure   [] Palpitations   [] Shortness of breath when laying flat   [] Shortness of breath at rest   [] Shortness of breath with exertion. Vascular:  [x] Pain in legs with walking   [x] Pain in legs at rest   [] Pain in legs when laying flat   [] Claudication   [] Pain in feet when walking  [] Pain in feet at rest  [] Pain in feet when laying flat   [] History of DVT   [] Phlebitis   [x] Swelling in legs   [x] Varicose veins   [] Non-healing ulcers Pulmonary:   [] Uses home oxygen   [] Productive cough   [] Hemoptysis   [] Wheeze  [] COPD   [] Asthma Neurologic:  [] Dizziness  [] Blackouts   [] Seizures   [] History of stroke   [] History of TIA  [] Aphasia   [] Temporary blindness   [] Dysphagia   [] Weakness or numbness in arms   [] Weakness or numbness in legs Musculoskeletal:  []   Arthritis   [] Joint swelling   [] Joint pain   [] Low back pain Hematologic:  [] Easy bruising  [] Easy bleeding   [] Hypercoagulable state   [] Anemic   Gastrointestinal:  [] Blood in stool   [] Vomiting blood  [] Gastroesophageal reflux/heartburn   [] Abdominal pain Genitourinary:  [] Chronic kidney disease   [] Difficult urination  [] Frequent urination  [] Burning with urination   [] Hematuria Skin:  [] Rashes   [] Ulcers   [] Wounds Psychological:  [] History of anxiety   []  History of major depression.  Physical Examination  BP (!) 160/96 (BP Location: Right Arm, Patient Position: Sitting, Cuff Size: Normal)   Pulse 66   Resp 14   Ht 5\' 3"  (1.6 m)   Wt 181 lb 3.2 oz (82.2 kg)   BMI 32.10 kg/m  Gen:  WD/WN, NAD Head: [place]/AT, No temporalis  wasting. Ear/Nose/Throat: Hearing grossly intact, nares w/o erythema or drainage Eyes: Conjunctiva clear. Sclera non-icteric Neck: Supple.  Trachea midline Pulmonary:  Good air movement, no use of accessory muscles.  Cardiac: RRR, no JVD Vascular:  Vessel Right Left  Radial Palpable Palpable                          PT Palpable Palpable  DP Palpable Palpable   Gastrointestinal: soft, non-tender/non-distended. No guarding/reflex.  Musculoskeletal: M/S 5/5 throughout.  No deformity or atrophy. 1+ LLE edema. Neurologic: Sensation grossly intact in extremities.  Symmetrical.  Speech is fluent.  Psychiatric: Judgment intact, Mood & affect appropriate for pt's clinical situation. Dermatologic: No rashes or ulcers noted.  No cellulitis or open wounds.       Labs No results found for this or any previous visit (from the past 2160 hour(s)).  Radiology Mm 3d Screen Breast Bilateral  Result Date: 01/27/2018 CLINICAL DATA:  Screening. EXAM: DIGITAL SCREENING BILATERAL MAMMOGRAM WITH TOMO AND CAD COMPARISON:  Previous exam(s). ACR Breast Density Category b: There are scattered areas of fibroglandular density. FINDINGS: There are no findings suspicious for malignancy. Images were processed with CAD. IMPRESSION: No mammographic evidence of malignancy. A result letter of this screening mammogram will be mailed directly to the patient. RECOMMENDATION: Screening mammogram in one year. (Code:SM-B-01Y) BI-RADS CATEGORY  1: Negative. Electronically Signed   By: Curlene Dolphin M.D.   On: 01/27/2018 16:39    Assessment/Plan  Pain in limb Her noninvasive studies today showed normal ABIs and waveforms bilaterally consistent with no arterial insufficiency.  Her venous duplex shows left great saphenous vein reflux throughout the left leg as well as some deep venous reflux bilaterally.  No DVT or superficial thrombophlebitis were identified.  Varicose veins of left lower extremity with  inflammation Recommend  I have reviewed my previous  discussion with the patient regarding  varicose veins and why they cause symptoms. Patient will continue  wearing graduated compression stockings class 1 on a daily basis, beginning first thing in the morning and removing them in the evening.    In addition, behavioral modification including elevation during the day was again discussed and this will continue.  The patient has utilized over the counter pain medications and has been exercising.  However, at this time conservative therapy has not alleviated the patient's symptoms of leg pain and swelling  Recommend: laser ablation of the left great saphenous veins to eliminate the symptoms of pain and swelling of the left lower extremity caused by the severe superficial venous reflux disease.     Leotis Pain, MD  02/11/2018 2:35 PM  This note was created with Dragon medical transcription system.  Any errors from dictation are purely unintentional

## 2018-02-11 NOTE — Assessment & Plan Note (Signed)
Recommend  I have reviewed my previous  discussion with the patient regarding  varicose veins and why they cause symptoms. Patient will continue  wearing graduated compression stockings class 1 on a daily basis, beginning first thing in the morning and removing them in the evening.    In addition, behavioral modification including elevation during the day was again discussed and this will continue.  The patient has utilized over the counter pain medications and has been exercising.  However, at this time conservative therapy has not alleviated the patient's symptoms of leg pain and swelling  Recommend: laser ablation of the left great saphenous veins to eliminate the symptoms of pain and swelling of the left lower extremity caused by the severe superficial venous reflux disease.

## 2018-02-11 NOTE — Patient Instructions (Signed)
Nonsurgical Procedures for Varicose Veins, Care After  This sheet gives you information about how to care for yourself after your procedure. Your health care provider may also give you more specific instructions. If you have problems or questions, contact your health care provider.  What can I expect after the procedure?  After the procedure, it is common to have:  · Swelling.  · Bruising.  · Soreness.  · Mild skin discoloration.  · Slight bleeding at the incision sites.  Follow these instructions at home:  Incision or puncture site care  · Follow instructions from your health care provider about how to take care of your incision or puncture site. Make sure you:  ? Wash your hands with soap and water before you change your bandage (dressing). If soap and water are not available, use hand sanitizer.  ? Change your dressing as told by your health care provider.  ? Leave skin glue or adhesive strips in place. These skin closures may need to stay in place for 2 weeks or longer. If adhesive strip edges start to loosen and curl up, you may trim the loose edges. Do not remove adhesive strips completely unless your health care provider tells you to do that.  · Check your incision or puncture area every day for signs of infection. Check for:  ? Redness, swelling, or pain.  ? Fluid or blood.  ? Warmth.  ? Pus or a bad smell.  General instructions    · Take over-the-counter and prescription medicines only as told by your health care provider.  · Wear compression stockings as told by your health care provider. These stockings help to prevent blood clots and reduce swelling in your legs.  · Do not take baths, swim, or use a hot tub until your health care provider approves. Ask your health care provider if you can take showers.  · Wear loose-fitting clothing.  · Return to your normal activities as told by your health care provider. Ask your health care provider what activities are safe for you.  · Get regular daily exercise. Walk  or ride a stationary bike daily or as told by your health care provider.  · Keep all follow-up visits as told by your health care provider. This is important.  Contact a health care provider if:  · You have a fever.  · You have redness, swelling, or pain around your incision or puncture site.  · You have fluid or blood coming from your incision or puncture site.  · Your incision or puncture site feels warm to the touch.  · You have pus or a bad smell coming from your incision or puncture site.  · You develop a cough.  Get help right away if:  · You pass out.  · You have very bad pain in your leg.  · You have leg pain that gets worse when you walk.  · You have redness or swelling in your leg that is getting worse.  · You have trouble breathing.  · You cough up blood.  Summary  · After the procedure, it is common to have swelling, bruising, soreness, or mild skin discoloration.  · Follow instructions from your health care provider about how to take care of your incision or puncture site.  · Wear compression stockings as told by your health care provider. These stockings help to prevent blood clots and reduce swelling in your legs.  This information is not intended to replace advice given to you by 

## 2018-02-11 NOTE — Assessment & Plan Note (Signed)
Her noninvasive studies today showed normal ABIs and waveforms bilaterally consistent with no arterial insufficiency.  Her venous duplex shows left great saphenous vein reflux throughout the left leg as well as some deep venous reflux bilaterally.  No DVT or superficial thrombophlebitis were identified.

## 2018-03-07 ENCOUNTER — Encounter (INDEPENDENT_AMBULATORY_CARE_PROVIDER_SITE_OTHER): Payer: Self-pay | Admitting: Vascular Surgery

## 2018-03-07 ENCOUNTER — Ambulatory Visit (INDEPENDENT_AMBULATORY_CARE_PROVIDER_SITE_OTHER): Payer: Medicare HMO | Admitting: Vascular Surgery

## 2018-03-07 VITALS — BP 138/91 | HR 64 | Resp 18 | Ht 63.0 in | Wt 173.0 lb

## 2018-03-07 DIAGNOSIS — I8312 Varicose veins of left lower extremity with inflammation: Secondary | ICD-10-CM

## 2018-03-07 NOTE — Progress Notes (Signed)
Tabitha Gonzales is a 68 y.o. female who presents with symptomatic venous reflux  Past Medical History:  Diagnosis Date  . Cancer ([place])    skin ca  . GERD (gastroesophageal reflux disease)   . Hypercholesteremia   . Hypertension   . Osteopenia     Past Surgical History:  Procedure Laterality Date  . ABDOMINAL HYSTERECTOMY    . BREAST CYST ASPIRATION Right 20 years ago   neg  . COLONOSCOPY WITH PROPOFOL N/A 07/03/2016   Procedure: COLONOSCOPY WITH PROPOFOL;  Surgeon: Lollie Sails, MD;  Location: [facility] ENDOSCOPY;  Service: Endoscopy;  Laterality: N/A;  . right shoulder rotater Gonzales       Current Outpatient Medications:  .  aspirin EC 81 MG tablet, Take 81 mg by mouth daily., Disp: , Rfl:  .  esomeprazole (NEXIUM) 40 MG capsule, Take 40 mg by mouth daily at 12 noon., Disp: , Rfl:  .  fluticasone (FLONASE) 50 MCG/ACT nasal spray, Place 1 spray into both nostrils daily., Disp: , Rfl:  .  Multiple Vitamin (MULTIVITAMIN) tablet, Take 1 tablet by mouth daily., Disp: , Rfl:   Allergies  Allergen Reactions  . Sulfa Antibiotics Itching     Varicose veins of left lower extremity with inflammation     PLAN: The patient's left lower extremity was sterilely prepped and draped. The ultrasound machine was used to visualize the saphenous vein throughout its course. A segment in the upper calf was selected for access. The saphenous vein was accessed without difficulty using ultrasound guidance with a micropuncture needle. A 0.018 wire was then placed beyond the saphenofemoral junction and the needle was removed. The 65 cm sheath was then placed over the wire and the wire and dilator were removed. The laser fiber was then placed through the sheath and its tip was placed approximately 4-5 centimeters below the saphenofemoral junction. Tumescent anesthesia was then created with a dilute lidocaine solution. Laser energy was then delivered with constant withdrawal of the sheath and laser fiber.  Approximately 1174 joules of energy were delivered over a length of 33 centimeters using a 1470 Hz VenaCure machine at 7 W. Sterile dressings were placed. The patient tolerated the procedure well without obvious complications.   Follow-up in 1 week with post-laser duplex.

## 2018-03-10 ENCOUNTER — Ambulatory Visit (INDEPENDENT_AMBULATORY_CARE_PROVIDER_SITE_OTHER): Payer: Medicare HMO

## 2018-03-10 DIAGNOSIS — I8312 Varicose veins of left lower extremity with inflammation: Secondary | ICD-10-CM

## 2018-04-04 ENCOUNTER — Ambulatory Visit (INDEPENDENT_AMBULATORY_CARE_PROVIDER_SITE_OTHER): Payer: Medicare HMO | Admitting: Nurse Practitioner

## 2018-04-04 ENCOUNTER — Encounter (INDEPENDENT_AMBULATORY_CARE_PROVIDER_SITE_OTHER): Payer: Self-pay | Admitting: Nurse Practitioner

## 2018-04-04 ENCOUNTER — Other Ambulatory Visit: Payer: Self-pay

## 2018-04-04 VITALS — BP 131/84 | HR 82 | Resp 82 | Ht 63.0 in | Wt 173.0 lb

## 2018-04-04 DIAGNOSIS — Z9889 Other specified postprocedural states: Secondary | ICD-10-CM

## 2018-04-04 DIAGNOSIS — I8312 Varicose veins of left lower extremity with inflammation: Secondary | ICD-10-CM | POA: Diagnosis not present

## 2018-04-04 DIAGNOSIS — K219 Gastro-esophageal reflux disease without esophagitis: Secondary | ICD-10-CM | POA: Diagnosis not present

## 2018-04-04 DIAGNOSIS — I10 Essential (primary) hypertension: Secondary | ICD-10-CM | POA: Diagnosis not present

## 2018-04-04 DIAGNOSIS — Z79899 Other long term (current) drug therapy: Secondary | ICD-10-CM

## 2018-04-06 ENCOUNTER — Encounter (INDEPENDENT_AMBULATORY_CARE_PROVIDER_SITE_OTHER): Payer: Self-pay | Admitting: Nurse Practitioner

## 2018-04-06 NOTE — Progress Notes (Signed)
SUBJECTIVE:  Patient ID: Tabitha Gonzales, female    DOB: 1950-05-12, 68 y.o.   MRN: [id number] Chief Complaint  Patient presents with  . Follow-up    HPI  Tabitha Gonzales is a 68 y.o. female The patient returns to the office for followup status post laser ablation of the left great saphenous vein on 03/07/2018. The patient notes multiple residual varicosities bilaterally which continued to hurt with dependent positions and remained tender to palpation. The patient's swelling is unchanged from preoperative status. The patient continues to wear graduated compression stockings on a daily basis but these are not eliminating the pain and discomfort. The patient continues to use over-the-counter anti-inflammatory medications to treat the pain and related symptoms but this has not given the patient relief. The patient notes the pain in the lower extremities is causing problems with daily exercise, problems at work and even with household activities such as preparing meals and doing dishes.  The patient is otherwise done well and there have been no complications related to the laser procedure or interval changes in the patient's overall   Venous ultrasound post laser shows successful laser ablation of the left great saphenous vein, no DVT identified.  Past Medical History:  Diagnosis Date  . Cancer ([place])    skin ca  . GERD (gastroesophageal reflux disease)   . Hypercholesteremia   . Hypertension   . Osteopenia     Past Surgical History:  Procedure Laterality Date  . ABDOMINAL HYSTERECTOMY    . BREAST CYST ASPIRATION Right 20 years ago   neg  . COLONOSCOPY WITH PROPOFOL N/A 07/03/2016   Procedure: COLONOSCOPY WITH PROPOFOL;  Surgeon: Lollie Sails, MD;  [facility] END[facility]COPY;  Service: Endoscopy;  Laterality: N/A;  . right shoulder rotater cuff      Social History   Socioeconomic History  . Marital status: Married    Spouse name: Not on file  . Number of children: Not  on file  . Years of education: Not on file  . Highest education level: Not on file  Occupational History  . Not on file  Social Needs  . Financial resource strain: Not on file  . Food insecurity:    Worry: Not on file    Inability: Not on file  . Transportation needs:    Medical: Not on file    Non-medical: Not on file  Tobacco Use  . Smoking status: Never Smoker  . Smokeless tobacco: Never Used  Substance and Sexual Activity  . Alcohol use: No  . Drug use: No  . Sexual activity: Not on file  Lifestyle  . Physical activity:    Days per week: Not on file    Minutes per session: Not on file  . Stress: Not on file  Relationships  . Social connections:    Talks on phone: Not on file    Gets together: Not on file    Attends religious service: Not on file    Active member of club or organization: Not on file    Attends meetings of clubs or organizations: Not on file    Relationship status: Not on file  . Intimate partner violence:    Fear of current or ex partner: Not on file    Emotionally abused: Not on file    Physically abused: Not on file    Forced sexual activity: Not on file  Other Topics Concern  . Not on file  Social History Narrative  . Not on file  Family History  Problem Relation Age of Onset  . Breast cancer Cousin        pat cousin    Allergies  Allergen Reactions  . Sulfa Antibiotics Itching     Review of Systems   Review of Systems: Negative Unless Checked Constitutional: [] Weight loss  [] Fever  [] Chills Cardiac: [] Chest pain   []  Atrial Fibrillation  [] Palpitations   [] Shortness of breath when laying flat   [] Shortness of breath with exertion. [] Shortness of breath at rest Vascular:  [] Pain in legs with walking   [] Pain in legs with standing [] Pain in legs when laying flat   [] Claudication    [] Pain in feet when laying flat    [] History of DVT   [] Phlebitis   [x] Swelling in legs   [x] Varicose veins   [] Non-healing ulcers Pulmonary:   [] Uses  home oxygen   [] Productive cough   [] Hemoptysis   [] Wheeze  [] COPD   [] Asthma Neurologic:  [] Dizziness   [] Seizures  [] Blackouts [] History of stroke   [] History of TIA  [] Aphasia   [] Temporary Blindness   [] Weakness or numbness in arm   [] Weakness or numbness in leg Musculoskeletal:   [] Joint swelling   [] Joint pain   [] Low back pain  []  History of Knee Replacement [] Arthritis [] back Surgeries  []  Spinal Stenosis    Hematologic:  [] Easy bruising  [] Easy bleeding   [] Hypercoagulable state   [] Anemic Gastrointestinal:  [] Diarrhea   [] Vomiting  [] Gastroesophageal reflux/heartburn   [] Difficulty swallowing. [] Abdominal pain Genitourinary:  [] Chronic kidney disease   [] Difficult urination  [] Anuric   [] Blood in urine [] Frequent urination  [] Burning with urination   [] Hematuria Skin:  [] Rashes   [] Ulcers [] Wounds Psychological:  [] History of anxiety   []  History of major depression  []  Memory Difficulties      OBJECTIVE:   Physical Exam  BP 131/84 (BP Location: Left Arm, Patient Position: Sitting, Cuff Size: Small)   Pulse 82   Resp (!) 82   Ht 5\' 3"  (1.6 m)   Wt 173 lb (78.5 kg)   BMI 30.65 kg/m   Gen: WD/WN, NAD Head: [place]/AT, No temporalis wasting.  Ear/Nose/Throat: Hearing grossly intact, nares w/o erythema or drainage Eyes: PER, EOMI, sclera nonicteric.  Neck: Supple, no masses.  No JVD.  Pulmonary:  Good air movement, no use of accessory muscles.  Cardiac: RRR Vascular: scattered varicosities present bilaterally.  Mild venous stasis changes to the legs bilaterally.  2+ soft pitting edema  Vessel Right Left  Radial Palpable Palpable  Dorsalis Pedis Palpable Palpable  Posterior Tibial Palpable Palpable   Gastrointestinal: soft, non-distended. No guarding/no peritoneal signs.  Musculoskeletal: M/S 5/5 throughout.  No deformity or atrophy.  Neurologic: Pain and light touch intact in extremities.  Symmetrical.  Speech is fluent. Motor exam as listed above. Psychiatric: Judgment  intact, Mood & affect appropriate for pt's clinical situation. Dermatologic: No Venous rashes. No Ulcers Noted.  No changes consistent with cellulitis. Lymph : No Cervical lymphadenopathy, no lichenification or skin changes of chronic lymphedema.       ASSESSMENT AND PLAN:  1. Varicose veins of left lower extremity with inflammation Recommend:  The patient has had successful ablation of the previously incompetent saphenous venous system but still has persistent symptoms of pain and swelling that are having a negative impact on daily life and daily activities.  Patient should undergo injection sclerotherapy to treat the residual varicosities.  The risks, benefits and alternative therapies were reviewed in detail with the patient.  All questions  were answered.  The patient agrees to proceed with sclerotherapy at their convenience.  The patient will continue wearing the graduated compression stockings and using the over-the-counter pain medications to treat her symptoms.       2. GERD without esophagitis Continue PPI as already ordered, this medication has been reviewed and there are no changes at this time.  Avoidence of caffeine and alcohol  Moderate elevation of the head of the bed   3. HTN, goal below 140/90 Continue antihypertensive medications as already ordered, these medications have been reviewed and there are no changes at this time.    Current Outpatient Medications on File Prior to Visit  Medication Sig Dispense Refill  . aspirin EC 81 MG tablet Take 81 mg by mouth daily.    Marland Kitchen esomeprazole (NEXIUM) 40 MG capsule Take 40 mg by mouth daily at 12 noon.    . fluticasone (FLONASE) 50 MCG/ACT nasal spray Place 1 spray into both nostrils daily.    . Multiple Vitamin (MULTIVITAMIN) tablet Take 1 tablet by mouth daily.    . rosuvastatin (CRESTOR) 5 MG tablet Take 5 mg by mouth daily.     No current facility-administered medications on file prior to visit.     There are no  Patient Instructions on file for this visit. No follow-ups on file.   Kris Hartmann, NP  This note was completed with Sales executive.  Any errors are purely unintentional.

## 2018-04-23 ENCOUNTER — Ambulatory Visit (INDEPENDENT_AMBULATORY_CARE_PROVIDER_SITE_OTHER): Payer: Medicare HMO | Admitting: Nurse Practitioner

## 2018-05-14 ENCOUNTER — Ambulatory Visit: Payer: Self-pay | Admitting: Urology

## 2018-05-14 ENCOUNTER — Ambulatory Visit (INDEPENDENT_AMBULATORY_CARE_PROVIDER_SITE_OTHER): Payer: Medicare HMO | Admitting: Nurse Practitioner

## 2018-06-04 ENCOUNTER — Ambulatory Visit (INDEPENDENT_AMBULATORY_CARE_PROVIDER_SITE_OTHER): Payer: Medicare HMO | Admitting: Nurse Practitioner

## 2018-07-30 ENCOUNTER — Encounter (INDEPENDENT_AMBULATORY_CARE_PROVIDER_SITE_OTHER): Payer: Self-pay | Admitting: Vascular Surgery

## 2018-07-30 ENCOUNTER — Other Ambulatory Visit: Payer: Self-pay

## 2018-07-30 ENCOUNTER — Ambulatory Visit (INDEPENDENT_AMBULATORY_CARE_PROVIDER_SITE_OTHER): Payer: Medicare HMO | Admitting: Vascular Surgery

## 2018-07-30 VITALS — BP 130/84 | HR 69 | Resp 16 | Wt 173.6 lb

## 2018-07-30 DIAGNOSIS — I8312 Varicose veins of left lower extremity with inflammation: Secondary | ICD-10-CM | POA: Diagnosis not present

## 2018-07-30 NOTE — Progress Notes (Signed)
Varicose veins of left lower extremity with inflammation (454.1  I83.10) Current Plans   Indication: Patient presents with symptomatic varicose veins of the left lower extremity.   Procedure: Sclerotherapy using hypertonic saline mixed with 1% Lidocaine was performed on the left lower extremity. Compression wraps were placed. The patient tolerated the procedure well. 

## 2018-08-05 ENCOUNTER — Ambulatory Visit: Payer: Medicare HMO | Admitting: Urology

## 2018-08-05 ENCOUNTER — Other Ambulatory Visit: Payer: Self-pay

## 2018-08-05 ENCOUNTER — Encounter: Payer: Self-pay | Admitting: Urology

## 2018-08-05 VITALS — BP 151/87 | HR 79 | Ht 63.0 in | Wt 175.0 lb

## 2018-08-05 DIAGNOSIS — R300 Dysuria: Secondary | ICD-10-CM | POA: Diagnosis not present

## 2018-08-05 LAB — URINALYSIS, COMPLETE
Bilirubin, UA: NEGATIVE
Glucose, UA: NEGATIVE
Ketones, UA: NEGATIVE
Nitrite, UA: NEGATIVE
Protein,UA: NEGATIVE
Specific Gravity, UA: 1.015 (ref 1.005–1.030)
Urobilinogen, Ur: 0.2 mg/dL (ref 0.2–1.0)
pH, UA: 6.5 (ref 5.0–7.5)

## 2018-08-05 LAB — MICROSCOPIC EXAMINATION

## 2018-08-05 LAB — BLADDER SCAN AMB NON-IMAGING

## 2018-08-05 NOTE — Progress Notes (Signed)
08/05/2018 12:32 PM   Tabitha Gonzales 1950-05-20 [id number]  Referring provider: Idelle Crouch, MD [facility] [place],  [place] [address]  No chief complaint on file.   HPI: Tabitha Gonzales is a 68 y.o. female seen today by request of [place], Tabitha Douglas, MD for evaluation of recurrent UTIs. Chart review reveals one UTI in September 2019 with >100k CFUs E coli with subsequent negative urine culture on 11/06/2017 and 11/10/2017. Urine culture on 04/05/2018 revealed 10-25k CFU E coli; no follow-up cultures obtained.   Of note, patient has had multiple instances of microscopic hematuria on UAs dating between 02/14/2015 and 03/27/2018, with cell counts ranging between 4-50/HPF. Patient underwent pelvic and renal ultrasound for evaluation of hematuria in January 2017; both scans were normal.  Patient is s/p hysterectomy with right oophorectomy.   She is a never smoker. She denies history of nephrolithiasis.   Patient reports 10-15 year history of microscopic hematuria with occasional gross hematuria. She has been receiving care in [place], [place], but could not recall the name of her urological provider there. She reports having had her bladder "scraped out" approximately 10 years ago and believes this occurred after passing clots in her urine. She does not recall further imaging evaluation aside from the ultrasounds mentioned above.  She denies personal history of bladder cancer.  Patient reports pelvic pressure and tugging with urination that coincides with her believed UTIs. She states she has been treated with antibiotics for these with symptom resolution. She denies urinary frequency, urgency, and dysuria with these infections. She denies dyspareunia and reports some vaginal dryness. She is post-menopausal.  PMH: Past Medical History:  Diagnosis Date  . Cancer ([place])    skin ca  . GERD (gastroesophageal reflux disease)   . Hypercholesteremia   . Hypertension    . Osteopenia     Surgical History: Past Surgical History:  Procedure Laterality Date  . ABDOMINAL HYSTERECTOMY    . BREAST CYST ASPIRATION Right 20 years ago   neg  . COLONOSCOPY WITH PROPOFOL N/A 07/03/2016   Procedure: COLONOSCOPY WITH PROPOFOL;  Surgeon: Tabitha Sails, MD;  Location: [facility] ENDOSCOPY;  Service: Endoscopy;  Laterality: N/A;  . right shoulder rotater cuff      Home Medications:  Allergies as of 08/05/2018      Reactions   Sulfa Antibiotics Itching      Medication List       Accurate as of August 05, 2018 12:32 PM. If you have any questions, ask your nurse or doctor.        amLODipine 5 MG tablet Commonly known as: NORVASC Take 5 mg by mouth daily.   aspirin EC 81 MG tablet Take 81 mg by mouth daily.   Biotin 10 MG Caps Take by mouth.   esomeprazole 40 MG capsule Commonly known as: NEXIUM Take 40 mg by mouth daily at 12 noon.   fluticasone 50 MCG/ACT nasal spray Commonly known as: FLONASE Place 1 spray into both nostrils daily.   losartan 50 MG tablet Commonly known as: COZAAR Take by mouth.   multivitamin tablet Take 1 tablet by mouth daily.   rosuvastatin 5 MG tablet Commonly known as: CRESTOR Take 5 mg by mouth daily.       Allergies:  Allergies  Allergen Reactions  . Sulfa Antibiotics Itching    Family History: Family History  Problem Relation Age of Onset  . Breast cancer Cousin        pat  cousin    Social History:  reports that she has never smoked. She has never used smokeless tobacco. She reports that she does not drink alcohol or use drugs.  ROS: UROLOGY Frequent Urination?: No Hard to postpone urination?: No Burning/pain with urination?: No Get up at night to urinate?: No Leakage of urine?: No Urine stream starts and stops?: No Trouble starting stream?: No Do you have to strain to urinate?: No Blood in urine?: No Urinary tract infection?: No Sexually transmitted disease?: No Injury to kidneys or bladder?:  No Painful intercourse?: No Weak stream?: No Currently pregnant?: No Vaginal bleeding?: No Last menstrual period?: n  Gastrointestinal Nausea?: No Vomiting?: No Indigestion/heartburn?: No Diarrhea?: No Constipation?: No  Constitutional Fever: No Night sweats?: No Weight loss?: No Fatigue?: No  Skin Skin rash/lesions?: No Itching?: No  Eyes Blurred vision?: No Double vision?: No  Ears/Nose/Throat Sore throat?: No Sinus problems?: No  Hematologic/Lymphatic Swollen glands?: No Easy bruising?: No  Cardiovascular Leg swelling?: No Chest pain?: No  Respiratory Cough?: No Shortness of breath?: No  Endocrine Excessive thirst?: No  Musculoskeletal Back pain?: No Joint pain?: No  Neurological Headaches?: No Dizziness?: No  Psychologic Depression?: No Anxiety?: No  Physical Exam: BP (!) 151/87   Pulse 79   Ht 5\' 3"  (1.6 m)   Wt 175 lb (79.4 kg)   BMI 31.00 kg/m   Constitutional:  Alert and oriented, No acute distress. HEENT: [place] AT, moist mucus membranes.  Trachea midline, no masses. Cardiovascular: No clubbing, cyanosis, or edema. Respiratory: Normal respiratory effort, no increased work of breathing. GI: Abdomen is obese Skin: No rashes, bruises or suspicious lesions. Neurologic: Grossly intact, no focal deficits, moving all 4 extremities. Psychiatric: Normal mood and affect.  Laboratory Data: Lab Results  Component Value Date   WBC 6.5 04/11/2012   HGB 12.0 04/11/2012   HCT 37.0 04/11/2012   MCV 85 04/11/2012   PLT 248 04/11/2012    Lab Results  Component Value Date   CREATININE 0.99 04/11/2012    Urinalysis Urinalysis shows 3-10 red blood cells per high-power field, otherwise unremarkable.  See epic for details.  Pertinent Imaging: Results for orders placed or performed in visit on 08/05/18  Bladder Scan (Post Void Residual) in office  Result Value Ref Range   Scan Result 26ml    Results for orders placed during the hospital  encounter of 03/01/15  US Renal   Narrative CLINICAL DATA:  Pelvic pain with microscopic hematuria  EXAM: RENAL / URINARY TRACT ULTRASOUND COMPLETE  COMPARISON:  None.  FINDINGS: Right Kidney:  Length: 8.9 cm. Echogenicity within normal limits. No mass or hydronephrosis visualized.  Left Kidney:  Length: 9.6 cm. Echogenicity within normal limits. No mass or hydronephrosis visualized.  Bladder:  Appears normal for degree of bladder distention.  IMPRESSION: Normal renal ultrasound   Electronically Signed   By: Skipper Cliche M.D.   On: 03/01/2015 15:17    Ultrasound was personally reviewed today.  Agree with radiologic interpretation.  No masses or stones are appreciated.  Normal-appearing bladder.  Assessment & Plan:    1. Microscopic hematuria Given persistent microscopic hematuria and occasional gross hematuria without documented workup per AUA guidelines, will proceed with this at this point. Counseled patient that her hematuria should be monitored at least every 2-3 years for possible evolution of urinary tract abnormalities. She is agreeable to this plan. UA with microscopic hematuria at this visit. - Bladder Scan (Post Void Residual) in office - Urinalysis, Complete -Return in about  4 weeks (around 09/02/2018) for cysto/CT scan.   2. UTIs Only one clinically confirmed UTI available per patient's record; UA negative for UTI at this visit. Counseled patient that approximately one UTI per year is typical in postmenopausal women and that further intervention is unnecessary at this point. Discussed UTI prevention strategies including initiation of oral OTC cranberry supplements and probiotics. Counseled patient to seek care in our office for future UTIs so that we can manage them from a urologic standpoint.  In the future, if she has more frequent infections, could consider topical estrogen cream.  Hollice Espy, MD  [facility] [address] [place], [place] [address] [phone]

## 2018-08-12 ENCOUNTER — Ambulatory Visit: Payer: Self-pay | Admitting: Urology

## 2018-08-20 ENCOUNTER — Ambulatory Visit (INDEPENDENT_AMBULATORY_CARE_PROVIDER_SITE_OTHER): Payer: Medicare HMO | Admitting: Vascular Surgery

## 2018-08-21 ENCOUNTER — Ambulatory Visit: Payer: Medicare HMO

## 2018-08-22 ENCOUNTER — Other Ambulatory Visit: Payer: Self-pay

## 2018-08-22 ENCOUNTER — Ambulatory Visit: Payer: Medicare HMO | Admitting: Physician Assistant

## 2018-08-22 ENCOUNTER — Encounter: Payer: Self-pay | Admitting: Physician Assistant

## 2018-08-22 VITALS — BP 133/92 | HR 82 | Ht 63.0 in | Wt 172.0 lb

## 2018-08-22 DIAGNOSIS — R300 Dysuria: Secondary | ICD-10-CM | POA: Diagnosis not present

## 2018-08-22 DIAGNOSIS — R310 Gross hematuria: Secondary | ICD-10-CM

## 2018-08-22 DIAGNOSIS — R3129 Other microscopic hematuria: Secondary | ICD-10-CM

## 2018-08-22 LAB — URINALYSIS, COMPLETE
Bilirubin, UA: NEGATIVE
Glucose, UA: NEGATIVE
Ketones, UA: NEGATIVE
Nitrite, UA: POSITIVE — AB
Specific Gravity, UA: 1.02 (ref 1.005–1.030)
Urobilinogen, Ur: 0.2 mg/dL (ref 0.2–1.0)
pH, UA: 5.5 (ref 5.0–7.5)

## 2018-08-22 LAB — MICROSCOPIC EXAMINATION: WBC, UA: >30 /hpf — AB (ref 0–5)

## 2018-08-22 MED ORDER — NITROFURANTOIN MONOHYD MACRO 100 MG PO CAPS: 100.0000 mg | ORAL_CAPSULE | Freq: Two times a day (BID) | ORAL | 0 refills | Status: AC

## 2018-08-22 NOTE — Progress Notes (Signed)
08/22/2018 2:42 PM   Tabitha Gonzales Jan 21, 1951 [id number]  CC: Dysuria  HPI: SHEELAH RITACCO is a 68 y.o. female who presents today for evaluation of possible UTI. She is an established [facility] patient who last saw Dr. Erlene Quan on 08/05/2018 for intermittent microscopic and gross hematuria and recurrent UTIs with insufficient clinical data to correlate. At that time she was scheduled to begin a microscopic hematuria workup and counseled to return to our office with new UTI symptoms for urinalysis and culture.  In the interim, patient was treated with a 7-day course of doxycycline for a tick bite. She reports having completed the course as prescribed.  Patient reports dysuria and the sensation of urethral tugging that started 5 days ago. She has been taking Azo with symptom relief. She denies fevers, chills, nausea, vomiting, and flank pain.  Patient does have a history of UTI, most recently in September 2019 and treated with ciprofloxacin 500mg  BID x7 days and then cefdinir 300mg  BID x7 days when her symptoms did not improve.   In office UA today was positive for WBCs, positive for RBCs, positive for protein, positive for nitrates and positive for leukocytes.  She is allergic to sulfa antibiotics.  PMH: Past Medical History:  Diagnosis Date  . Cancer ([place])    skin ca  . GERD (gastroesophageal reflux disease)   . Hypercholesteremia   . Hypertension   . Osteopenia     Surgical History: Past Surgical History:  Procedure Laterality Date  . ABDOMINAL HYSTERECTOMY    . BREAST CYST ASPIRATION Right 20 years ago   neg  . COLONOSCOPY WITH PROPOFOL N/A 07/03/2016   Procedure: COLONOSCOPY WITH PROPOFOL;  Surgeon: Lollie Sails, MD;  Location: [facility] ENDOSCOPY;  Service: Endoscopy;  Laterality: N/A;  . right shoulder rotater cuff      Home Medications:  Allergies as of 08/22/2018      Reactions   Sulfa Antibiotics Itching      Medication List       Accurate as of August 22, 2018  2:42 PM. If you have any questions, ask your nurse or doctor.        amLODipine 5 MG tablet Commonly known as: NORVASC Take 5 mg by mouth daily.   aspirin EC 81 MG tablet Take 81 mg by mouth daily.   Biotin 10 MG Caps Take by mouth.   esomeprazole 40 MG capsule Commonly known as: NEXIUM Take 40 mg by mouth daily at 12 noon.   fluticasone 50 MCG/ACT nasal spray Commonly known as: FLONASE Place 1 spray into both nostrils daily.   losartan 50 MG tablet Commonly known as: COZAAR Take by mouth.   multivitamin tablet Take 1 tablet by mouth daily.   rosuvastatin 5 MG tablet Commonly known as: CRESTOR Take 5 mg by mouth daily.       Allergies:  Allergies  Allergen Reactions  . Sulfa Antibiotics Itching    Family History: Family History  Problem Relation Age of Onset  . Breast cancer Cousin        pat cousin    Social History:   reports that she has never smoked. She has never used smokeless tobacco. She reports that she does not drink alcohol or use drugs.  ROS: UROLOGY Frequent Urination?: No Hard to postpone urination?: No Burning/pain with urination?: Yes Get up at night to urinate?: No Leakage of urine?: No Urine stream starts and stops?: No Trouble starting stream?: No Do you have to strain to  urinate?: No Blood in urine?: No Urinary tract infection?: No Sexually transmitted disease?: No Injury to kidneys or bladder?: No Painful intercourse?: No Weak stream?: No Currently pregnant?: No Vaginal bleeding?: No Last menstrual period?: n  Gastrointestinal Nausea?: No Vomiting?: No Indigestion/heartburn?: No Diarrhea?: No Constipation?: No  Constitutional Fever: No Night sweats?: No Weight loss?: No Fatigue?: No  Skin Skin rash/lesions?: No Itching?: No  Eyes Blurred vision?: No Double vision?: No  Ears/Nose/Throat Sore throat?: No Sinus problems?: No  Hematologic/Lymphatic Swollen glands?: No Easy bruising?: No   Cardiovascular Leg swelling?: No Chest pain?: No  Respiratory Cough?: No Shortness of breath?: No  Endocrine Excessive thirst?: No  Musculoskeletal Back pain?: No Joint pain?: No  Neurological Headaches?: No Dizziness?: No  Psychologic Depression?: No Anxiety?: No  Physical Exam: BP (!) 133/92 (BP Location: Left Arm, Patient Position: Sitting)   Pulse 82   Ht 5\' 3"  (1.6 m)   Wt 172 lb (78 kg)   BMI 30.47 kg/m   Constitutional:  Alert and oriented, No acute distress. Cardiovascular: No clubbing, cyanosis, or edema. Respiratory: Normal respiratory effort, no increased work of breathing. GU: No CVA tenderness Skin: No bruises or suspicious lesions. Neurologic: Grossly intact, no focal deficits, moving all 4 extremities. Psychiatric: Normal mood and affect.  Laboratory Data: Urinalysis    Component Value Date/Time   APPEARANCEUR Cloudy (A) 08/22/2018 1427   GLUCOSEU Negative 08/22/2018 1427   BILIRUBINUR Negative 08/22/2018 1427   PROTEINUR Trace (A) 08/22/2018 1427   NITRITE Positive (A) 08/22/2018 1427   LEUKOCYTESUR 3+ (A) 08/22/2018 1427    Results for orders placed or performed in visit on 08/22/18  CULTURE, URINE COMPREHENSIVE     Status: Abnormal   Collection Time: 08/22/18  2:27 PM   Specimen: Urine   UR  Result Value Ref Range Status   Urine Culture, Comprehensive Final report (A)  Final   Organism ID, Bacteria Escherichia coli (A)  Final    Comment: Greater than 100,000 colony forming units per mL Cefazolin <=4 ug/mL Cefazolin with an MIC <=16 predicts susceptibility to the oral agents cefaclor, cefdinir, cefpodoxime, cefprozil, cefuroxime, cephalexin, and loracarbef when used for therapy of uncomplicated urinary tract infections due to E. coli, Klebsiella pneumoniae, and Proteus mirabilis.    ANTIMICROBIAL SUSCEPTIBILITY Comment  Final    Comment:       ** S = Susceptible; I = Intermediate; R = Resistant **                    P = Positive; N  = Negative             MICS are expressed in micrograms per mL    Antibiotic                 RSLT#1    RSLT#2    RSLT#3    RSLT#4 Amoxicillin/Clavulanic Acid    S Ampicillin                     R Cefepime                       S Ceftriaxone                    S Cefuroxime                     S Ciprofloxacin  S Ertapenem                      S Gentamicin                     S Imipenem                       S Levofloxacin                   S Meropenem                      S Nitrofurantoin                 S Piperacillin/Tazobactam        S Tetracycline                   R Tobramycin                     S Trimethoprim/Sulfa             R   Microscopic Examination     Status: Abnormal   Collection Time: 08/22/18  2:27 PM   URINE  Result Value Ref Range Status   WBC, UA >30 (A) 0 - 5 /hpf Final   RBC 3-10 (A) 0 - 2 /hpf Final   Epithelial Cells (non renal) 0-10 0 - 10 /hpf Final   Bacteria, UA Many (A) None seen/Few Final     Assessment & Plan:   1. Dysuria Patient with symptoms and UA consistent with uncomplicated UTI. Urine culture sent today. Low clinical suspicion for pyelonephritis given absence of fever, chills, nausea, vomiting, flank pain, and CVA tenderness.   Patient with allergy to sulfa antibiotics and recent treatment with doxycycline. Will order nitrofurantoin today. Counseled patient to call the office if she develops sign of kidney infection including fever, chills, nausea, vomiting, or flank pain, or if her dysuria worsens or fails to improve after her prescribed antibiotic course. She expressed an understanding of this plan. -Urinalysis, complete -Urine culture -Nitrofurantoin 100mg  BIDx5 days  2. Intermittent gross and microscopic hematuria Patient was already scheduled to initiate hematuria workup with cysto and follow-up appointment scheduled with Dr. Erlene Quan on 09/10/2018. No orders seen in patient's chart for CT hematuria and patient stated she  had not received a call to schedule this. Submitted orders for this today and counseled patient that she should anticipate a call in the coming days to schedule.  -CT hematuria order, ideally to be completed prior to 8/12 cysto appointment with Dr. Belenda Cruise, [facility] [address], [place], [place] [address] [phone]

## 2018-08-24 LAB — CULTURE, URINE COMPREHENSIVE

## 2018-08-27 ENCOUNTER — Other Ambulatory Visit: Payer: Self-pay

## 2018-08-27 ENCOUNTER — Encounter (INDEPENDENT_AMBULATORY_CARE_PROVIDER_SITE_OTHER): Payer: Self-pay | Admitting: Vascular Surgery

## 2018-08-27 ENCOUNTER — Ambulatory Visit (INDEPENDENT_AMBULATORY_CARE_PROVIDER_SITE_OTHER): Payer: Medicare HMO | Admitting: Vascular Surgery

## 2018-08-27 VITALS — BP 146/88 | HR 72 | Resp 16 | Wt 173.8 lb

## 2018-08-27 DIAGNOSIS — I8312 Varicose veins of left lower extremity with inflammation: Secondary | ICD-10-CM | POA: Diagnosis not present

## 2018-08-27 NOTE — Progress Notes (Signed)
Varicose veins of left lower extremity with inflammation (454.1  I83.10) Current Plans   Indication: Patient presents with symptomatic varicose veins of the left lower extremity.   Procedure: Sclerotherapy using hypertonic saline mixed with 1% Lidocaine was performed on the left lower extremity. Compression wraps were placed. The patient tolerated the procedure well. 

## 2018-08-29 ENCOUNTER — Telehealth: Payer: Self-pay | Admitting: Urology

## 2018-08-29 ENCOUNTER — Other Ambulatory Visit: Payer: Self-pay

## 2018-08-29 ENCOUNTER — Encounter: Payer: Self-pay | Admitting: Physician Assistant

## 2018-08-29 ENCOUNTER — Ambulatory Visit (INDEPENDENT_AMBULATORY_CARE_PROVIDER_SITE_OTHER): Payer: Medicare HMO | Admitting: Physician Assistant

## 2018-08-29 VITALS — BP 126/84 | HR 86 | Ht 63.0 in | Wt 170.0 lb

## 2018-08-29 DIAGNOSIS — R300 Dysuria: Secondary | ICD-10-CM | POA: Diagnosis not present

## 2018-08-29 LAB — URINALYSIS, COMPLETE
Bilirubin, UA: NEGATIVE
Glucose, UA: NEGATIVE
Ketones, UA: NEGATIVE
Nitrite, UA: NEGATIVE
Protein,UA: NEGATIVE
Specific Gravity, UA: 1.02 (ref 1.005–1.030)
Urobilinogen, Ur: 0.2 mg/dL (ref 0.2–1.0)
pH, UA: 5 (ref 5.0–7.5)

## 2018-08-29 LAB — MICROSCOPIC EXAMINATION: WBC, UA: >30 /hpf — AB (ref 0–5)

## 2018-08-29 MED ORDER — PREMARIN 0.625 MG/GM VA CREA: 1.0000 | TOPICAL_CREAM | VAGINAL | 0 refills | Status: DC

## 2018-08-29 NOTE — Telephone Encounter (Signed)
Pt called again and was put on lab schedule to drop off a urine sample.

## 2018-08-29 NOTE — Progress Notes (Signed)
08/29/2018 4:23 PM   XANDRA LARAMEE 03-24-1950 [id number]  CC: Vaginal burning  HPI: Tabitha Gonzales is a 68 y.o. female who presents today for evaluation of possible UTI. She is an established [facility] patient who last saw me on 08/22/2018 for acute UTI, at which point she was prescribed nitrofurantoin 100mg  BID x5 days. Urine culture returned with >100k CFUs E coli sensitive to nitrofurantoin.  Patient is expected to undergo workup for intermittent microscopic and gross hematuria with Dr. Erlene Quan in the coming weeks.  Patient reports that she completed her prescribed course of nitrofurantoin 2 days ago and took the medication as scheduled with no missed doses. She notes symptom resolution at that time. However, she states that starting yesterday afternoon, she has noticed burning in her vaginal area when she urinates such that she wonders if applying Vaseline to the area would provide relief.   She denies nausea, vomiting, fevers, chills, flank pain, vaginal itching, and vaginal discharge. She is post-menopausal and does note some vaginal dryness. She does not have a personal history of breast cancer.  Patient does have a history of UTI, most recently in September 2019 and treated with Cipro 500mg  BID x7 days and then Cefdinir 300mg  BID x7 days when her symptoms did not improve. Upon questioning, she recalls that her dysuria symptoms persisted after treatment of this infection.  In-office UA today positive for blood and leukocyte esterase; urine microscopy with >30 WBCs/HPF and 3-10 RBCs/HPF.  She is allergic to sulfa antibiotics.  PMH: Past Medical History:  Diagnosis Date  . Cancer ([place])    skin ca  . GERD (gastroesophageal reflux disease)   . Hypercholesteremia   . Hypertension   . Osteopenia     Surgical History: Past Surgical History:  Procedure Laterality Date  . ABDOMINAL HYSTERECTOMY    . BREAST CYST ASPIRATION Right 20 years ago   neg  . COLONOSCOPY WITH  PROPOFOL N/A 07/03/2016   Procedure: COLONOSCOPY WITH PROPOFOL;  Surgeon: Lollie Sails, MD;  Location: [facility] ENDOSCOPY;  Service: Endoscopy;  Laterality: N/A;  . right shoulder rotater cuff      Home Medications:  Allergies as of 08/29/2018      Reactions   Sulfa Antibiotics Itching      Medication List       Accurate as of August 29, 2018  4:23 PM. If you have any questions, ask your nurse or doctor.        amLODipine 5 MG tablet Commonly known as: NORVASC Take 5 mg by mouth daily.   aspirin EC 81 MG tablet Take 81 mg by mouth daily.   Biotin 10 MG Caps Take by mouth.   esomeprazole 40 MG capsule Commonly known as: NEXIUM Take 40 mg by mouth daily at 12 noon.   fluticasone 50 MCG/ACT nasal spray Commonly known as: FLONASE Place 1 spray into both nostrils daily.   losartan 50 MG tablet Commonly known as: COZAAR Take by mouth.   multivitamin tablet Take 1 tablet by mouth daily.   rosuvastatin 5 MG tablet Commonly known as: CRESTOR Take 5 mg by mouth daily.       Allergies:  Allergies  Allergen Reactions  . Sulfa Antibiotics Itching    Family History: Family History  Problem Relation Age of Onset  . Breast cancer Cousin        pat cousin    Social History:   reports that she has never smoked. She has never used smokeless tobacco. She reports  that she does not drink alcohol or use drugs.  ROS: UROLOGY Frequent Urination?: No Hard to postpone urination?: No Burning/pain with urination?: Yes Get up at night to urinate?: No Leakage of urine?: No Urine stream starts and stops?: No Trouble starting stream?: No Do you have to strain to urinate?: No Blood in urine?: No Urinary tract infection?: Yes Sexually transmitted disease?: No Injury to kidneys or bladder?: No Painful intercourse?: No Weak stream?: No Currently pregnant?: No Vaginal bleeding?: No Last menstrual period?: n  Gastrointestinal Nausea?: No Vomiting?: No  Indigestion/heartburn?: No Diarrhea?: No Constipation?: No  Constitutional Fever: No Night sweats?: No Weight loss?: No Fatigue?: No  Skin Skin rash/lesions?: No Itching?: No  Eyes Blurred vision?: No Double vision?: No  Ears/Nose/Throat Sore throat?: No Sinus problems?: No  Hematologic/Lymphatic Swollen glands?: No Easy bruising?: No  Cardiovascular Leg swelling?: No Chest pain?: No  Respiratory Cough?: No Shortness of breath?: No  Endocrine Excessive thirst?: No  Musculoskeletal Back pain?: No Joint pain?: No  Neurological Headaches?: No Dizziness?: No  Psychologic Depression?: No Anxiety?: No  Physical Exam: BP 126/84 (BP Location: Left Arm, Patient Position: Sitting)   Pulse 86   Ht 5\' 3"  (1.6 m)   Wt 170 lb (77.1 kg)   BMI 30.11 kg/m   Constitutional:  Alert and oriented, No acute distress. Cardiovascular: No clubbing, cyanosis, or edema. Respiratory: Normal respiratory effort, no increased work of breathing. GU: Pelvic exam refused Skin: No rashes, bruises or suspicious lesions. Neurologic: Grossly intact, no focal deficits, moving all 4 extremities. Psychiatric: Normal mood and affect.  Laboratory Data: Urinalysis    Component Value Date/Time   APPEARANCEUR Cloudy (A) 08/29/2018 1622   GLUCOSEU Negative 08/29/2018 1622   BILIRUBINUR Negative 08/29/2018 1622   PROTEINUR Negative 08/29/2018 1622   NITRITE Negative 08/29/2018 1622   LEUKOCYTESUR 2+ (A) 08/29/2018 1622    Results for orders placed or performed in visit on 08/29/18  CULTURE, URINE COMPREHENSIVE     Status: None   Collection Time: 08/29/18  4:22 PM   Specimen: Urine   UR  Result Value Ref Range Status   Urine Culture, Comprehensive Final report  Final   Organism ID, Bacteria Comment  Final    Comment: Mixed urogenital flora 10,000-25,000 colony forming units per mL   Microscopic Examination     Status: Abnormal   Collection Time: 08/29/18  4:22 PM   URINE   Result Value Ref Range Status   WBC, UA >30 (A) 0 - 5 /hpf Final   RBC 3-10 (A) 0 - 2 /hpf Final   Epithelial Cells (non renal) 0-10 0 - 10 /hpf Final   Bacteria, UA Few None seen/Few Final   Assessment & Plan:   1. Vaginal burning Patient was treated appropriately for a nitrofurantoin-sensitive E coli UTI last week. She reports compliance with the prescribed therapy and has no renal insufficiency to support therapeutic failure in this case.   UA today significant for RBCs and WBCs, however given the patient's history of microscopic hematuria and recent UTI, I suspect culture will reveal sterile pyuria in the setting of a recent bladder infection. Of note, this week's UA is negative for nitrites, which may indicate a resolution of last week's nitrite-positive infection.  Based on the patient's post-menopausal status and description of burning in the vaginal area rather than in the urethra, I suspect her current complaint is indicative of atrophic vaginitis rather than an unresolved or recurrent UTI. Patient deferred pelvic exam today, however in  the absence of reported vaginal itching or discharge I am not concerned for possible vaginal candidiasis at this time.  The patient and I discussed antibiotic stewardship in approaching her care at this time. She would prefer to avoid taking a third course of antibiotics this month (she was previously also treated with doxycycline for a tick bite). I agree with this, as her PMH provides reasonable explanations for the positive findings on her UA today. Additionally, she denies warning signs for an upper tract infection.  I counseled the patient on the etiology and treatment of vaginal atrophy with topical estrogen cream as well as the persistence of irritative voiding symptoms after treating a UTI. I informed her that I would reach out with the results of her urine culture to adjust therapy as indicated. -Urinalysis, Complete -Urine culture -Premarin  cream MWF  Debroah Loop, PA-C  [facility] [address], [place], [place] [address] [phone]

## 2018-08-29 NOTE — Telephone Encounter (Signed)
Pt. Finished abx on Wednesday and is having stinging and burning with urination would like for someone to call her an discuss this issue and advise.

## 2018-08-31 LAB — CULTURE, URINE COMPREHENSIVE

## 2018-09-01 ENCOUNTER — Telehealth: Payer: Self-pay | Admitting: Physician Assistant

## 2018-09-01 NOTE — Telephone Encounter (Signed)
I just spoke with Mrs. Saiki over the phone to discuss the results of her urine culture from Friday. I explained that her urine culture was negative, indicating that she does not have a persistent UTI.  Patient stated she is feeling much better. She states that she started using A&D ointment topically for her vaginal burning and states that it provided almost immediate relief of her symptoms. She states that the Premarin cream I prescribed was cost prohibitive and so she would prefer to treat her symptoms with OTC remedies for now. I am in agreement with this plan and counseled her that she may also try OTC vaginal moisturizers for this.  Debroah Loop, PA-C 09/01/18 9:49 AM

## 2018-09-02 ENCOUNTER — Other Ambulatory Visit: Payer: Self-pay

## 2018-09-02 ENCOUNTER — Ambulatory Visit
Admission: RE | Admit: 2018-09-02 | Discharge: 2018-09-02 | Disposition: A | Discharge status: Home / Self Care | Payer: Medicare HMO | Source: Ambulatory Visit | Attending: Physician Assistant | Admitting: Physician Assistant

## 2018-09-02 DIAGNOSIS — R310 Gross hematuria: Secondary | ICD-10-CM | POA: Diagnosis present

## 2018-09-02 DIAGNOSIS — R3129 Other microscopic hematuria: Secondary | ICD-10-CM | POA: Insufficient documentation

## 2018-09-02 HISTORY — DX: Basal cell carcinoma of skin, unspecified: C44.91

## 2018-09-02 HISTORY — DX: Squamous cell carcinoma of skin, unspecified: C44.92

## 2018-09-02 LAB — POCT I-STAT CREATININE: Creatinine, Ser: 0.9 mg/dL (ref 0.44–1.00)

## 2018-09-02 MED ORDER — IOHEXOL 300 MG/ML  SOLN
125.0000 mL | Freq: Once | INTRAMUSCULAR | Status: AC | PRN
  Administered 2018-09-02: 125 mL via INTRAVENOUS

## 2018-09-10 ENCOUNTER — Other Ambulatory Visit: Payer: Self-pay

## 2018-09-10 ENCOUNTER — Ambulatory Visit (INDEPENDENT_AMBULATORY_CARE_PROVIDER_SITE_OTHER): Payer: Medicare HMO | Admitting: Vascular Surgery

## 2018-09-10 ENCOUNTER — Ambulatory Visit (INDEPENDENT_AMBULATORY_CARE_PROVIDER_SITE_OTHER): Payer: Medicare HMO | Admitting: Urology

## 2018-09-10 ENCOUNTER — Encounter: Payer: Self-pay | Admitting: Urology

## 2018-09-10 VITALS — BP 131/84 | HR 71 | Ht 63.0 in | Wt 174.0 lb

## 2018-09-10 DIAGNOSIS — R300 Dysuria: Secondary | ICD-10-CM

## 2018-09-10 DIAGNOSIS — R3129 Other microscopic hematuria: Secondary | ICD-10-CM

## 2018-09-10 NOTE — Progress Notes (Signed)
   09/11/18  CC:  Chief Complaint  Patient presents with  . Cysto    HPI: 68 year old female with a history of microscopic hematuria who presents today for cystoscopy to complete her evaluation.  Please see previous notes for details.  In the interim, she underwent CT urogram which was unremarkable.  Today, she complains of no dysuria or any other symptoms.  She is feeling quite well.  She started using a and D ointment which is given her relief in the vaginal area.  She elected not to fill her topical estrogen cream due to the concern for cost.  Given that she is feeling so much better, she is going to abstain from this for the time being.  Height 5\' 3"  (1.6 m). NED. A&Ox3.   No respiratory distress   Abd soft, NT, ND Normal external genitalia with patent urethral meatus  Cystoscopy Procedure Note  Patient identification was confirmed, informed consent was obtained, and patient was prepped using Betadine solution.  Lidocaine jelly was administered per urethral meatus.    Procedure: - Flexible cystoscope introduced, without any difficulty.   - Thorough search of the bladder revealed:    normal urethral meatus    normal urothelium    no stones    no ulcers     no tumors    no urethral polyps    no trabeculation  - Ureteral orifices were normal in position and appearance.  Post-Procedure: - Patient tolerated the procedure well  Assessment/ Plan:  1. Microscopic hematuria Cystoscopy today is unremarkable CT urogram is also negative without any significant GU pathology Etiology unclear but may be related to atrophic vaginitis - Urinalysis, Complete; Future  2. Dysuria Symptoms improved after treatment of UTI followed by use of a and D ointment Encouraged her if her symptoms recur, return for rule out UTI She would also likely benefit from topical estrogen cream but this was cost prohibitive for her I offered to call in the medication to compounding pharmacy today but  she declined She will let us know should like to do this down the road - Urinalysis, Complete    Return in about 1 year (around 09/10/2019) for PA.  Hollice Espy, MD

## 2018-09-11 LAB — URINALYSIS, COMPLETE
Bilirubin, UA: NEGATIVE
Glucose, UA: NEGATIVE
Ketones, UA: NEGATIVE
Leukocytes,UA: NEGATIVE
Nitrite, UA: NEGATIVE
Protein,UA: NEGATIVE
Specific Gravity, UA: 1.02 (ref 1.005–1.030)
Urobilinogen, Ur: 0.2 mg/dL (ref 0.2–1.0)
pH, UA: 7.5 (ref 5.0–7.5)

## 2018-09-11 LAB — MICROSCOPIC EXAMINATION: WBC, UA: NONE SEEN /hpf (ref 0–5)

## 2018-09-24 ENCOUNTER — Ambulatory Visit (INDEPENDENT_AMBULATORY_CARE_PROVIDER_SITE_OTHER): Payer: Medicare HMO | Admitting: Vascular Surgery

## 2018-09-24 ENCOUNTER — Encounter (INDEPENDENT_AMBULATORY_CARE_PROVIDER_SITE_OTHER): Payer: Self-pay | Admitting: Vascular Surgery

## 2018-09-24 ENCOUNTER — Other Ambulatory Visit: Payer: Self-pay

## 2018-09-24 VITALS — BP 132/80 | HR 76 | Resp 16 | Wt 173.8 lb

## 2018-09-24 DIAGNOSIS — M79605 Pain in left leg: Secondary | ICD-10-CM

## 2018-09-24 DIAGNOSIS — M79604 Pain in right leg: Secondary | ICD-10-CM | POA: Diagnosis not present

## 2018-09-24 DIAGNOSIS — I8312 Varicose veins of left lower extremity with inflammation: Secondary | ICD-10-CM

## 2018-09-24 NOTE — Progress Notes (Signed)
Varicose veins of bilateral lower extremity with inflammation (454.1  I83.10) Current Plans   Indication: Patient presents with symptomatic varicose veins of the bilateral lower extremity.   Procedure: Sclerotherapy using hypertonic saline mixed with 1% Lidocaine was performed on the bilateral lower extremity. Compression wraps were placed. The patient tolerated the procedure well.  Of Note: The patient has undergone a series of sclerotherapy to the bilateral lower extremities.  She still presents with numerous varicosities which are painful and interfere with her ability to function on daily basis which still remain on both extremities.  Would recommend another round of sclerotherapy and attempt to improve her discomfort and daily functioning.

## 2018-10-14 DIAGNOSIS — R739 Hyperglycemia, unspecified: Secondary | ICD-10-CM | POA: Insufficient documentation

## 2018-11-26 ENCOUNTER — Ambulatory Visit (INDEPENDENT_AMBULATORY_CARE_PROVIDER_SITE_OTHER): Payer: Medicare HMO | Admitting: Vascular Surgery

## 2018-12-24 ENCOUNTER — Ambulatory Visit (INDEPENDENT_AMBULATORY_CARE_PROVIDER_SITE_OTHER): Payer: Medicare HMO | Admitting: Vascular Surgery

## 2019-02-02 ENCOUNTER — Other Ambulatory Visit: Payer: Self-pay | Admitting: Internal Medicine

## 2019-02-02 DIAGNOSIS — Z1231 Encounter for screening mammogram for malignant neoplasm of breast: Secondary | ICD-10-CM

## 2019-02-04 ENCOUNTER — Ambulatory Visit (INDEPENDENT_AMBULATORY_CARE_PROVIDER_SITE_OTHER): Payer: Medicare HMO | Admitting: Vascular Surgery

## 2019-02-05 ENCOUNTER — Other Ambulatory Visit: Payer: Self-pay

## 2019-02-05 ENCOUNTER — Ambulatory Visit
Admission: RE | Admit: 2019-02-05 | Discharge: 2019-02-05 | Disposition: A | Discharge status: Home / Self Care | Payer: Medicare HMO | Source: Ambulatory Visit | Attending: Internal Medicine | Admitting: Internal Medicine

## 2019-02-05 DIAGNOSIS — Z1231 Encounter for screening mammogram for malignant neoplasm of breast: Secondary | ICD-10-CM

## 2019-06-23 ENCOUNTER — Other Ambulatory Visit: Payer: Self-pay

## 2019-06-23 ENCOUNTER — Ambulatory Visit: Payer: Medicare HMO | Admitting: Podiatry

## 2019-06-23 DIAGNOSIS — B351 Tinea unguium: Secondary | ICD-10-CM | POA: Diagnosis not present

## 2019-06-26 ENCOUNTER — Encounter: Payer: Self-pay | Admitting: Podiatry

## 2019-06-26 NOTE — Progress Notes (Signed)
Subjective:  Patient ID: Tabitha Gonzales, female    DOB: 08-27-1950,  MRN: [id number]  Chief Complaint  Patient presents with  . Nail Problem    pt is here for possible toenail fungus of the left big toenail, pt states that it has been going on for about 3-5 months, pt states that there is no overall pain, but is looking to get it looked at.    69 y.o. female presents with the above complaint.  Patient presents with toenail fungus of left big toe that has been going for 3 to 5 months.  There is no pain associated with it.  Patient has tried over-the-counter cream but not much as well.  She would like to discuss all the options for treating fungus.  She denies any other acute complaints.  She has not seen anyone else prior to seeing me.   Review of Systems: Negative except as noted in the HPI. Denies N/V/F/Ch.  Past Medical History:  Diagnosis Date  . Adenomatous polyp of ascending colon 02/21/2015  . GERD (gastroesophageal reflux disease)   . Hypercholesteremia   . Hypertension   . Osteopenia   . Skin cancer, basal cell   . Squamous cell skin cancer     Current Outpatient Medications:  .  amLODipine (NORVASC) 5 MG tablet, Take 5 mg by mouth daily., Disp: , Rfl:  .  aspirin EC 81 MG tablet, Take 81 mg by mouth daily., Disp: , Rfl:  .  Biotin 10 MG CAPS, Take by mouth., Disp: , Rfl:  .  esomeprazole (NEXIUM) 40 MG capsule, Take 40 mg by mouth daily at 12 noon., Disp: , Rfl:  .  fluticasone (FLONASE) 50 MCG/ACT nasal spray, Place 1 spray into both nostrils daily., Disp: , Rfl:  .  Multiple Vitamin (MULTIVITAMIN) tablet, Take 1 tablet by mouth daily., Disp: , Rfl:  .  rosuvastatin (CRESTOR) 5 MG tablet, Take 5 mg by mouth daily., Disp: , Rfl:  .  losartan (COZAAR) 50 MG tablet, Take by mouth., Disp: , Rfl:   Social History   Tobacco Use  Smoking Status Never Smoker  Smokeless Tobacco Never Used    Allergies  Allergen Reactions  . Sulfa Antibiotics Itching   Objective:    There were no vitals filed for this visit. There is no height or weight on file to calculate BMI. Constitutional Well developed. Well nourished.  Vascular Dorsalis pedis pulses palpable bilaterally. Posterior tibial pulses palpable bilaterally. Capillary refill normal to all digits.  No cyanosis or clubbing noted. Pedal hair growth normal.  Neurologic Normal speech. Oriented to person, place, and time. Epicritic sensation to light touch grossly present bilaterally.  Dermatologic Nails thickened elongated dystrophic toenail of the left hallux noted.  No pain on palpation to the nail.  No other abnormalities noted to the rest of the toes. Skin within normal limits  Orthopedic: Normal joint ROM without pain or crepitus bilaterally. No visible deformities. No bony tenderness.   Radiographs: None Assessment:   1. Onychomycosis due to dermatophyte   2. Nail fungus    Plan:  Patient was evaluated and treated and all questions answered.  Left hallux onychomycosis/nail fungus -Educated the patient on the etiology of onychomycosis and various treatment options associated with improving the fungal load.  I explained to the patient that there is 3 treatment options available to treat the onychomycosis including topical, p.o., laser treatment.  Patient would like to not treat this nail fungus at this time.  I discussed with  the patient in the future if she was interested in treating it we can discuss options at that time.  She denies any other acute complaints.   No follow-ups on file.

## 2019-09-10 ENCOUNTER — Ambulatory Visit: Payer: Medicare HMO | Admitting: Urology

## 2019-10-21 ENCOUNTER — Other Ambulatory Visit: Payer: Self-pay | Admitting: Internal Medicine

## 2019-10-21 DIAGNOSIS — Z1231 Encounter for screening mammogram for malignant neoplasm of breast: Secondary | ICD-10-CM

## 2020-02-03 ENCOUNTER — Other Ambulatory Visit: Payer: Self-pay | Admitting: Orthopedic Surgery

## 2020-02-03 DIAGNOSIS — G8929 Other chronic pain: Secondary | ICD-10-CM

## 2020-02-03 DIAGNOSIS — M25312 Other instability, left shoulder: Secondary | ICD-10-CM

## 2020-02-03 DIAGNOSIS — S46212D Strain of muscle, fascia and tendon of other parts of biceps, left arm, subsequent encounter: Secondary | ICD-10-CM

## 2020-02-09 ENCOUNTER — Other Ambulatory Visit: Payer: Self-pay

## 2020-02-09 ENCOUNTER — Ambulatory Visit
Admission: RE | Admit: 2020-02-09 | Discharge: 2020-02-09 | Disposition: A | Discharge status: Home / Self Care | Payer: Medicare HMO | Source: Ambulatory Visit | Attending: Internal Medicine | Admitting: Internal Medicine

## 2020-02-09 DIAGNOSIS — Z1231 Encounter for screening mammogram for malignant neoplasm of breast: Secondary | ICD-10-CM | POA: Diagnosis present

## 2020-02-10 ENCOUNTER — Ambulatory Visit
Admission: RE | Admit: 2020-02-10 | Discharge: 2020-02-10 | Disposition: A | Discharge status: Home / Self Care | Payer: Medicare HMO | Source: Ambulatory Visit | Attending: Orthopedic Surgery | Admitting: Orthopedic Surgery

## 2020-02-10 ENCOUNTER — Ambulatory Visit: Payer: Medicare HMO

## 2020-02-10 DIAGNOSIS — G8929 Other chronic pain: Secondary | ICD-10-CM | POA: Insufficient documentation

## 2020-02-10 DIAGNOSIS — M25312 Other instability, left shoulder: Secondary | ICD-10-CM

## 2020-02-10 DIAGNOSIS — M25512 Pain in left shoulder: Secondary | ICD-10-CM | POA: Insufficient documentation

## 2020-02-10 DIAGNOSIS — S46212D Strain of muscle, fascia and tendon of other parts of biceps, left arm, subsequent encounter: Secondary | ICD-10-CM | POA: Insufficient documentation

## 2020-03-01 ENCOUNTER — Other Ambulatory Visit: Payer: Self-pay | Admitting: Orthopedic Surgery

## 2020-03-02 ENCOUNTER — Other Ambulatory Visit: Payer: Self-pay | Admitting: Orthopedic Surgery

## 2020-03-03 ENCOUNTER — Other Ambulatory Visit: Payer: Self-pay

## 2020-03-03 ENCOUNTER — Encounter: Payer: Self-pay | Admitting: Orthopedic Surgery

## 2020-03-09 ENCOUNTER — Other Ambulatory Visit
Admission: RE | Admit: 2020-03-09 | Discharge: 2020-03-09 | Disposition: A | Discharge status: Home / Self Care | Payer: Medicare HMO | Source: Ambulatory Visit | Attending: Orthopedic Surgery | Admitting: Orthopedic Surgery

## 2020-03-09 ENCOUNTER — Other Ambulatory Visit: Payer: Self-pay

## 2020-03-09 DIAGNOSIS — Z20822 Contact with and (suspected) exposure to covid-19: Secondary | ICD-10-CM | POA: Insufficient documentation

## 2020-03-09 DIAGNOSIS — Z01812 Encounter for preprocedural laboratory examination: Secondary | ICD-10-CM | POA: Insufficient documentation

## 2020-03-09 LAB — SARS CORONAVIRUS 2 (TAT 6-24 HRS): SARS Coronavirus 2: NEGATIVE

## 2020-03-11 ENCOUNTER — Encounter
Admission: RE | Disposition: A | Discharge status: Home / Self Care | Payer: Self-pay | Source: Home / Self Care | Attending: Orthopedic Surgery

## 2020-03-11 ENCOUNTER — Ambulatory Visit: Payer: Medicare HMO | Admitting: Anesthesiology

## 2020-03-11 ENCOUNTER — Encounter: Payer: Self-pay | Admitting: Orthopedic Surgery

## 2020-03-11 ENCOUNTER — Other Ambulatory Visit: Payer: Self-pay

## 2020-03-11 ENCOUNTER — Ambulatory Visit
Admission: RE | Admit: 2020-03-11 | Discharge: 2020-03-11 | Disposition: A | Discharge status: Home / Self Care | Payer: Medicare HMO | Attending: Orthopedic Surgery | Admitting: Orthopedic Surgery

## 2020-03-11 DIAGNOSIS — X58XXXA Exposure to other specified factors, initial encounter: Secondary | ICD-10-CM | POA: Insufficient documentation

## 2020-03-11 DIAGNOSIS — Z791 Long term (current) use of non-steroidal anti-inflammatories (NSAID): Secondary | ICD-10-CM | POA: Insufficient documentation

## 2020-03-11 DIAGNOSIS — M75102 Unspecified rotator cuff tear or rupture of left shoulder, not specified as traumatic: Secondary | ICD-10-CM | POA: Insufficient documentation

## 2020-03-11 DIAGNOSIS — M19012 Primary osteoarthritis, left shoulder: Secondary | ICD-10-CM | POA: Diagnosis not present

## 2020-03-11 DIAGNOSIS — I10 Essential (primary) hypertension: Secondary | ICD-10-CM | POA: Diagnosis not present

## 2020-03-11 DIAGNOSIS — Z882 Allergy status to sulfonamides status: Secondary | ICD-10-CM | POA: Diagnosis not present

## 2020-03-11 DIAGNOSIS — S46212A Strain of muscle, fascia and tendon of other parts of biceps, left arm, initial encounter: Secondary | ICD-10-CM | POA: Insufficient documentation

## 2020-03-11 DIAGNOSIS — Z7982 Long term (current) use of aspirin: Secondary | ICD-10-CM | POA: Insufficient documentation

## 2020-03-11 DIAGNOSIS — Z79899 Other long term (current) drug therapy: Secondary | ICD-10-CM | POA: Insufficient documentation

## 2020-03-11 HISTORY — DX: Unspecified osteoarthritis, unspecified site: M19.90

## 2020-03-11 HISTORY — DX: Dizziness and giddiness: R42

## 2020-03-11 HISTORY — PX: SHOULDER ARTHROSCOPY WITH ROTATOR CUFF REPAIR AND SUBACROMIAL DECOMPRESSION: SHX5686

## 2020-03-11 HISTORY — DX: Family history of other specified conditions: Z84.89

## 2020-03-11 HISTORY — DX: Other specified postprocedural states: Z98.890

## 2020-03-11 HISTORY — DX: Nausea with vomiting, unspecified: R11.2

## 2020-03-11 SURGERY — SHOULDER ARTHROSCOPY WITH ROTATOR CUFF REPAIR AND SUBACROMIAL DECOMPRESSION
Anesthesia: Regional | Laterality: Left

## 2020-03-11 MED ORDER — OXYCODONE HCL 5 MG/5ML PO SOLN: 5.0000 mg | Freq: Once | ORAL | Status: DC | PRN

## 2020-03-11 MED ORDER — ONDANSETRON 4 MG PO TBDP: 4.0000 mg | ORAL_TABLET | Freq: Three times a day (TID) | ORAL | 0 refills | Status: DC | PRN

## 2020-03-11 MED ORDER — ACETAMINOPHEN 500 MG PO TABS: 1000.0000 mg | ORAL_TABLET | Freq: Three times a day (TID) | ORAL | 2 refills | Status: DC

## 2020-03-11 MED ORDER — DEXAMETHASONE SODIUM PHOSPHATE 4 MG/ML IJ SOLN
INTRAMUSCULAR | Status: DC | PRN
  Administered 2020-03-11: 4 mg via INTRAVENOUS

## 2020-03-11 MED ORDER — BUPIVACAINE LIPOSOME 1.3 % IJ SUSP
INTRAMUSCULAR | Status: DC | PRN
  Administered 2020-03-11: 20 mL via PERINEURAL

## 2020-03-11 MED ORDER — ACETAMINOPHEN 325 MG PO TABS: 325.0000 mg | ORAL_TABLET | ORAL | Status: DC | PRN

## 2020-03-11 MED ORDER — ASPIRIN EC 325 MG PO TBEC: 325.0000 mg | DELAYED_RELEASE_TABLET | Freq: Every day | ORAL | 0 refills | Status: AC

## 2020-03-11 MED ORDER — OXYCODONE HCL 5 MG PO TABS: 5.0000 mg | ORAL_TABLET | Freq: Once | ORAL | Status: DC | PRN

## 2020-03-11 MED ORDER — ACETAMINOPHEN 160 MG/5ML PO SOLN: 325.0000 mg | ORAL | Status: DC | PRN

## 2020-03-11 MED ORDER — CEFAZOLIN SODIUM-DEXTROSE 2-4 GM/100ML-% IV SOLN
2.0000 g | INTRAVENOUS | Status: AC
  Administered 2020-03-11: 2 g via INTRAVENOUS

## 2020-03-11 MED ORDER — PROPOFOL 10 MG/ML IV BOLUS
INTRAVENOUS | Status: DC | PRN
  Administered 2020-03-11: 150 mg via INTRAVENOUS

## 2020-03-11 MED ORDER — MIDAZOLAM HCL 2 MG/2ML IJ SOLN
INTRAMUSCULAR | Status: DC | PRN
  Administered 2020-03-11: 2 mg via INTRAVENOUS

## 2020-03-11 MED ORDER — BUPIVACAINE HCL (PF) 0.5 % IJ SOLN
INTRAMUSCULAR | Status: DC | PRN
  Administered 2020-03-11: 20 mL

## 2020-03-11 MED ORDER — OXYCODONE HCL 5 MG PO TABS: 5.0000 mg | ORAL_TABLET | ORAL | 0 refills | Status: DC | PRN

## 2020-03-11 MED ORDER — LACTATED RINGERS IV SOLN
INTRAVENOUS | Status: DC | PRN
  Administered 2020-03-11: 4 mL

## 2020-03-11 MED ORDER — FENTANYL CITRATE (PF) 100 MCG/2ML IJ SOLN
INTRAMUSCULAR | Status: DC | PRN
  Administered 2020-03-11: 50 ug via INTRAVENOUS

## 2020-03-11 MED ORDER — LACTATED RINGERS IV SOLN: INTRAVENOUS | Status: DC

## 2020-03-11 MED ORDER — EPHEDRINE SULFATE 50 MG/ML IJ SOLN
INTRAMUSCULAR | Status: DC | PRN
  Administered 2020-03-11 (×2): 10 mg via INTRAVENOUS

## 2020-03-11 MED ORDER — ONDANSETRON HCL 4 MG/2ML IJ SOLN
INTRAMUSCULAR | Status: DC | PRN
Start: 2020-03-11 — End: 2020-03-11
  Administered 2020-03-11: 4 mg via INTRAVENOUS

## 2020-03-11 MED ORDER — FENTANYL CITRATE (PF) 100 MCG/2ML IJ SOLN: 25.0000 ug | INTRAMUSCULAR | Status: DC | PRN

## 2020-03-11 SURGICAL SUPPLY — 59 items
ADAPTER IRRIG TUBE 2 SPIKE SOL (ADAPTER) ×4 IMPLANT
ADH SKN CLS APL DERMABOND .7 (GAUZE/BANDAGES/DRESSINGS)
ADPR TBG 2 SPK PMP STRL ASCP (ADAPTER) ×2
ANCH SUT SWLK 19.1X4.75 (Anchor) ×2 IMPLANT
ANCHOR ICONIX SPEED 2.3 (Anchor) ×2 IMPLANT
ANCHOR SUT BIO SW 4.75X19.1 (Anchor) ×2 IMPLANT
APL PRP STRL LF DISP 70% ISPRP (MISCELLANEOUS) ×1
BUR BR 5.5 12 FLUTE (BURR) ×2 IMPLANT
BUR RADIUS 4.0X18.5 (BURR) ×2 IMPLANT
BUR RND 5.5 12 HOLLOW (BURR) ×1 IMPLANT
CANNULA PART THRD DISP 5.75X7 (CANNULA) ×2 IMPLANT
CANNULA PARTIAL THREAD 2X7 (CANNULA) ×3 IMPLANT
CANNULA TWIST IN 8.25X7CM (CANNULA) ×1 IMPLANT
CHLORAPREP W/TINT 26 (MISCELLANEOUS) ×2 IMPLANT
COOLER POLAR GLACIER W/PUMP (MISCELLANEOUS) ×2 IMPLANT
COVER LIGHT HANDLE UNIVERSAL (MISCELLANEOUS) ×4 IMPLANT
DERMABOND ADVANCED (GAUZE/BANDAGES/DRESSINGS)
DERMABOND ADVANCED .7 DNX12 (GAUZE/BANDAGES/DRESSINGS) ×1 IMPLANT
DRAPE IMP U-DRAPE 54X76 (DRAPES) ×4 IMPLANT
DRAPE INCISE IOBAN 66X45 STRL (DRAPES) ×2 IMPLANT
DRAPE SHEET LG 3/4 BI-LAMINATE (DRAPES) ×2 IMPLANT
DRAPE U-SHAPE 48X52 POLY STRL (PACKS) ×3 IMPLANT
DRSG TEGADERM 4X4.75 (GAUZE/BANDAGES/DRESSINGS) ×8 IMPLANT
ELECT REM PT RETURN 9FT ADLT (ELECTROSURGICAL) ×2
ELECTRODE REM PT RTRN 9FT ADLT (ELECTROSURGICAL) ×1 IMPLANT
GAUZE SPONGE 4X4 12PLY STRL (GAUZE/BANDAGES/DRESSINGS) ×2 IMPLANT
GAUZE XEROFORM 1X8 LF (GAUZE/BANDAGES/DRESSINGS) ×2 IMPLANT
GLOVE BIOGEL M 8.0 STRL (GLOVE) ×2 IMPLANT
GOWN STRL REIN 2XL XLG LVL4 (GOWN DISPOSABLE) ×2 IMPLANT
GOWN STRL REUS W/ TWL LRG LVL3 (GOWN DISPOSABLE) ×1 IMPLANT
GOWN STRL REUS W/TWL LRG LVL3 (GOWN DISPOSABLE) ×4
IV LACTATED RINGER IRRG 3000ML (IV SOLUTION) ×24
IV LR IRRIG 3000ML ARTHROMATIC (IV SOLUTION) ×8 IMPLANT
KIT STABILIZATION SHOULDER (MISCELLANEOUS) ×2 IMPLANT
KIT TURNOVER KIT A (KITS) ×2 IMPLANT
MANIFOLD 4PT FOR NEPTUNE1 (MISCELLANEOUS) ×2 IMPLANT
MASK FACE SPIDER DISP (MASK) ×2 IMPLANT
MAT ABSORB  FLUID 56X50 GRAY (MISCELLANEOUS) ×2
MAT ABSORB FLUID 56X50 GRAY (MISCELLANEOUS) ×2 IMPLANT
NDL SAFETY ECLIPSE 18X1.5 (NEEDLE) ×1 IMPLANT
NEEDLE HYPO 18GX1.5 SHARP (NEEDLE)
PACK ARTHROSCOPY SHOULDER (MISCELLANEOUS) ×2 IMPLANT
PAD WRAPON POLAR SHDR XLG (MISCELLANEOUS) ×1 IMPLANT
PASSER SUT FIRSTPASS SELF (INSTRUMENTS) ×1 IMPLANT
PENCIL SMOKE EVACUATOR (MISCELLANEOUS) ×2 IMPLANT
SET TUBE SUCT SHAVER OUTFL 24K (TUBING) ×2 IMPLANT
SPONGE GAUZE 2X2 8PLY STRL LF (GAUZE/BANDAGES/DRESSINGS) ×2 IMPLANT
SUT ETHILON 3-0 (SUTURE) ×2 IMPLANT
SUT MNCRL 4-0 (SUTURE)
SUT MNCRL 4-0 27XMFL (SUTURE)
SUT VIC AB 0 CT1 36 (SUTURE) ×1 IMPLANT
SUT VIC AB 2-0 CT2 27 (SUTURE) ×1 IMPLANT
SUTURE MNCRL 4-0 27XMF (SUTURE) ×1 IMPLANT
SYR 10ML LL (SYRINGE) ×1 IMPLANT
TAPE MICROFOAM 4IN (TAPE) ×2 IMPLANT
TUBING ARTHRO INFLOW-ONLY STRL (TUBING) ×2 IMPLANT
TUBING CONNECTING 10 (TUBING) ×2 IMPLANT
WAND WEREWOLF FLOW 90D (MISCELLANEOUS) ×2 IMPLANT
WRAPON POLAR PAD SHDR XLG (MISCELLANEOUS) ×2

## 2020-03-11 NOTE — Addendum Note (Signed)
Addendum  created 03/11/20 1009 by Cameron Ali, CRNA   Intraprocedure Meds edited

## 2020-03-11 NOTE — Transfer of Care (Signed)
Immediate Anesthesia Transfer of Care Note  Patient: Tabitha Gonzales  Procedure(s) Performed: Left shoulder arthroscopic rotator cuff repair and subacromial decompression - Reche Dixon to Assist (Left )  Patient Location: PACU  Anesthesia Type: General LMA, Regional  Level of Consciousness: awake, alert  and patient cooperative  Airway and Oxygen Therapy: Patient Spontanous Breathing and Patient connected to supplemental oxygen  Post-op Assessment: Post-op Vital signs reviewed, Patient's Cardiovascular Status Stable, Respiratory Function Stable, Patent Airway and No signs of Nausea or vomiting  Post-op Vital Signs: Reviewed and stable  Complications: No complications documented.

## 2020-03-11 NOTE — Anesthesia Procedure Notes (Addendum)
Anesthesia Regional Block: Interscalene brachial plexus block   Pre-Anesthetic Checklist: ,, timeout performed, Correct Patient, Correct Site, Correct Laterality, Correct Procedure, Correct Position, site marked, Risks and benefits discussed,  Surgical consent,  Pre-op evaluation,  At surgeon's request and post-op pain management  Laterality: Left  Prep: chloraprep       Needles:  Injection technique: Single-shot  Needle Type: Stimulator Needle - 40     Needle Length: 5cm  Needle Gauge: 21     Additional Needles:   Procedures:,,,, ultrasound used (permanent image in chart),,,,  Narrative:  Start time: 03/11/2020 6:57 AM End time: 03/11/2020 7:02 AM Injection made incrementally with aspirations every 5 mL.  Performed by: Personally   Additional Notes: Functioning IV was confirmed and monitors applied. Ultrasound guidance: relevant anatomy identified, needle position confirmed, local anesthetic spread visualized around nerve(s)., vascular puncture avoided.  Image printed for medical record.  Negative aspiration and no paresthesias; incremental administration of local anesthetic. The patient tolerated the procedure well. Vitals signes recorded in RN notes.

## 2020-03-11 NOTE — Discharge Instructions (Signed)
Post-Op Instructions - Rotator Cuff Repair  1. Bracing: You will wear a shoulder immobilizer or sling for 6 weeks.   2. Driving: No driving for 3 weeks post-op. When driving, do not wear the immobilizer. Ideally, we recommend no driving for 6 weeks while sling is in place as one arm will be immobilized.   3. Activity: No active lifting for 2 months. Wrist, hand, and elbow motion only. Avoid lifting the upper arm away from the body except for hygiene. You are permitted to bend and straighten the elbow passively only (no active elbow motion). You may use your hand and wrist for typing, writing, and managing utensils (cutting food). Do not lift more than a coffee cup for 8 weeks.  When sleeping or resting, inclined positions (recliner chair or wedge pillow) and a pillow under the forearm for support may provide better comfort for up to 4 weeks.  Avoid long distance travel for 4 weeks.  Return to normal activities after rotator cuff repair repair normally takes 6 months on average. If rehab goes very well, may be able to do most activities at 4 months, except overhead or contact sports.  4. Physical Therapy: Begins 3-4 days after surgery, and proceed 1 time per week for the first 6 weeks, then 1-2 times per week from weeks 6-20 post-op.  5. Medications:  - You will be provided a prescription for narcotic pain medicine. After surgery, take 1-2 narcotic tablets every 4 hours if needed for severe pain.  - A prescription for anti-nausea medication will be provided in case the narcotic medicine causes nausea - take 1 tablet every 6 hours only if nauseated.   - Take tylenol 1000 mg (2 Extra Strength tablets or 3 regular strength) every 8 hours for pain.  May decrease or stop tylenol 5 days after surgery if you are having minimal pain. - Take ASA 325mg/day x 2 weeks to help prevent DVTs/PEs (blood clots).  - DO NOT take ANY nonsteroidal anti-inflammatory pain medications (Advil, Motrin, Ibuprofen, Aleve,  Naproxen, or Naprosyn). These medicines can inhibit healing of your shoulder repair.    If you are taking prescription medication for anxiety, depression, insomnia, muscle spasm, chronic pain, or for attention deficit disorder, you are advised that you are at a higher risk of adverse effects with use of narcotics post-op, including narcotic addiction/dependence, depressed breathing, death. If you use non-prescribed substances: alcohol, marijuana, cocaine, heroin, methamphetamines, etc., you are at a higher risk of adverse effects with use of narcotics post-op, including narcotic addiction/dependence, depressed breathing, death. You are advised that taking > 50 morphine milligram equivalents (MME) of narcotic pain medication per day results in twice the risk of overdose or death. For your prescription provided: oxycodone 5 mg - taking more than 6 tablets per day would result in > 50 morphine milligram equivalents (MME) of narcotic pain medication. Be advised that we will prescribe narcotics short-term, for acute post-operative pain only - 3 weeks for major operations such as shoulder repair/reconstruction surgeries.     6. Post-Op Appointment:  Your first post-op appointment will be 10-14 days post-op.  7. Work or School: For most, but not all procedures, we advise staying out of work or school for at least 1 to 2 weeks in order to recover from the stress of surgery and to allow time for healing.   If you need a work or school note this can be provided.   8. Smoking: If you are a smoker, you need to refrain from   smoking in the postoperative period. The nicotine in cigarettes will inhibit healing of your shoulder repair and decrease the chance of successful repair. Similarly, nicotine containing products (gum, patches) should be avoided.   Post-operative Brace: Apply and remove the brace you received as you were instructed to at the time of fitting and as described in detail as the brace's  instructions for use indicate.  Wear the brace for the period of time prescribed by your physician.  The brace can be cleaned with soap and water and allowed to air dry only.  Should the brace result in increased pain, decreased feeling (numbness/tingling), increased swelling or an overall worsening of your medical condition, please contact your doctor immediately.  If an emergency situation occurs as a result of wearing the brace after normal business hours, please dial 911 and seek immediate medical attention.  Let your doctor know if you have any further questions about the brace issued to you. Refer to the shoulder sling instructions for use if you have any questions regarding the correct fit of your shoulder sling.  [place] [facility]bles[facility]ooting: [phone]  Video that illustrates how to properly use a shoulder sling: "Instructions for Proper Use of an Orthopaedic Sling" [email or web address]        General Anesthesia, Adult, Care After This sheet gives you information about how to care for yourself after your procedure. Your health care provider may also give you more specific instructions. If you have problems or questions, contact your health care provider. What can I expect after the procedure? After the procedure, the following side effects are common:  Pain or discomfort at the IV site.  Nausea.  Vomiting.  Sore throat.  Trouble concentrating.  Feeling cold or chills.  Feeling weak or tired.  Sleepiness and fatigue.  Soreness and body aches. These side effects can affect parts of the body that were not involved in surgery. Follow these instructions at home: For the time period you were told by your health care provider:  Rest.  Do not participate in activities where you could fall or become injured.  Do not drive or use machinery.  Do not drink alcohol.  Do not take sleeping pills or medicines that cause drowsiness.  Do not  make important decisions or sign legal documents.  Do not take care of children on your own.   Eating and drinking  Follow any instructions from your health care provider about eating or drinking restrictions.  When you feel hungry, start by eating small amounts of foods that are soft and easy to digest (bland), such as toast. Gradually return to your regular diet.  Drink enough fluid to keep your urine pale yellow.  If you vomit, rehydrate by drinking water, juice, or clear broth. General instructions  If you have sleep apnea, surgery and certain medicines can increase your risk for breathing problems. Follow instructions from your health care provider about wearing your sleep device: ? Anytime you are sleeping, including during daytime naps. ? While taking prescription pain medicines, sleeping medicines, or medicines that make you drowsy.  Have a responsible adult stay with you for the time you are told. It is important to have someone help care for you until you are awake and alert.  Return to your normal activities as told by your health care provider. Ask your health care provider what activities are safe for you.  Take over-the-counter and prescription medicines only as told by your health care provider.  If you smoke, do  not smoke without supervision.  Keep all follow-up visits as told by your health care provider. This is important. Contact a health care provider if:  You have nausea or vomiting that does not get better with medicine.  You cannot eat or drink without vomiting.  You have pain that does not get better with medicine.  You are unable to pass urine.  You develop a skin rash.  You have a fever.  You have redness around your IV site that gets worse. Get help right away if:  You have difficulty breathing.  You have chest pain.  You have blood in your urine or stool, or you vomit blood. Summary  After the procedure, it is common to have a sore throat  or nausea. It is also common to feel tired.  Have a responsible adult stay with you for the time you are told. It is important to have someone help care for you until you are awake and alert.  When you feel hungry, start by eating small amounts of foods that are soft and easy to digest (bland), such as toast. Gradually return to your regular diet.  Drink enough fluid to keep your urine pale yellow.  Return to your normal activities as told by your health care provider. Ask your health care provider what activities are safe for you. This information is not intended to replace advice given to you by your health care provider. Make sure you discuss any questions you have with your health care provider. Document Revised: 10/01/2019 Document Reviewed: 04/30/2019 Elsevier Patient Education  2021 [employer].

## 2020-03-11 NOTE — Anesthesia Postprocedure Evaluation (Signed)
Anesthesia Post Note  Patient: Tabitha Gonzales  Procedure(s) Performed: Left shoulder arthroscopic rotator cuff repair and subacromial decompression - Reche Dixon to Assist (Left )     Patient location during evaluation: PACU Anesthesia Type: Regional and General Level of consciousness: awake and alert Pain management: pain level controlled Vital Signs Assessment: post-procedure vital signs reviewed and stable Respiratory status: spontaneous breathing, nonlabored ventilation, respiratory function stable and patient connected to nasal cannula oxygen Cardiovascular status: blood pressure returned to baseline and stable Postop Assessment: no apparent nausea or vomiting Anesthetic complications: no   No complications documented.  Alisa Graff

## 2020-03-11 NOTE — Progress Notes (Signed)
[place] ANMD with left, ultrasound guided, interscalene  block. Side rails up, monitors on throughout procedure. See vital signs in flow sheet. Tolerated Procedure well.

## 2020-03-11 NOTE — Op Note (Addendum)
SURGERY DATE: 03/11/2020    PRE-OP DIAGNOSIS:  1. Left subacromial impingement 2. Left rotator cuff tear rotator cuff tear 3. Left proximal biceps rupture   POST-OP DIAGNOSIS: 1. Left subacromial impingement 2. Left rotator cuff tear rotator cuff tear 3. Left proximal biceps rupture   PROCEDURES:  1.   Left arthroscopic rotator cuff repair 2.   Left arthroscopic subacromial decompression 3.   Left arthroscopic extensive debridement of shoulder (glenohumeral and subacromial spaces)   SURGEON: Cato Mulligan, MD   ASSISTANT: Anitra Lauth, PA   ANESTHESIA: Gen with Exparil interscalene block   ESTIMATED BLOOD LOSS: 5cc   DRAINS:  none   TOTAL IV FLUIDS: per anesthesia      SPECIMENS: none   IMPLANTS: - Arthrex 4.4mm SwiveLock x 2 - Iconix SPEED double loaded with 1.2 and 2.28mm tape x 2     OPERATIVE FINDINGS:  Examination under anesthesia: A careful examination under anesthesia was performed.  Passive range of motion was: FF: 150; ER at side: 45; ER in abduction: 90; IR in abduction: 45.  Anterior load shift: NT.  Posterior load shift: NT.  Sulcus in neutral: NT.  Sulcus in ER: NT.     Intra-operative findings: A thorough arthroscopic examination of the shoulder was performed.  The findings are: 1. Biceps tendon: Not visualized within the joint with frayed remnant stump at the biceps anchor on the superior labrum 2. Superior labrum:  Mild erythema 3. Posterior labrum and capsule: normal 4. Inferior capsule and inferior recess: normal 5. Glenoid cartilage surface: Normal 6. Supraspinatus attachment: full-thickness tear of the supraspinatus 7. Posterior rotator cuff attachment: normal 8. Humeral head articular cartilage: normal 9. Rotator interval: significant synovitis 10: Subscapularis tendon: attachment intact 11. Anterior labrum: Mildly degenerative 12. IGHL: normal   OPERATIVE REPORT:    Indications for procedure: Tabitha Gonzales is a 70 y.o. female with  approximately 3 years of shoulder pain.  She has had multiple corticosteroid injections with excellent relief of her symptoms until recently when she did not get appropriate relief.  She is also undergone medical management, activity modification, and exercises without complete relief of her symptoms. Clinical exam and MRI were suggestive of rotator cuff tear, proximal biceps rupture, and subacromial impingement. After discussion of risks, benefits, and alternatives to surgery, the patient elected to proceed.    Procedure in detail:   I identified ATHALENE KOLLE in the pre-operative holding area.  I marked the operative shoulder with my initials. I reviewed the risks and benefits of the proposed surgical intervention, and the patient (and/or patient's guardian) wished to proceed.  Anesthesia was then performed with an Exparil interscalene block.  The patient was transferred to the operative suite and placed in the beach chair position.     Appropriate IV antibiotics were administered prior to incision. The operative upper extremity was then prepped and draped in standard fashion. A time out was performed confirming the correct extremity, correct patient, and correct procedure.    I then created a standard posterior portal with an 11 blade. The glenohumeral joint was easily entered with a blunt trocar and the arthroscope introduced. The findings of diagnostic arthroscopy are described above. I debrided degenerative tissue including remnant of the biceps stump, the synovitic tissue about the rotator interval, and anterior and superior labrum. I then coagulated the inflamed synovium to obtain hemostasis and reduce the risk of post-operative swelling using an Arthrocare radiofrequency device.  Next, the arthroscope was then introduced into the subacromial  space. A direct lateral portal was created with an 11-blade after spinal needle localization. An extensive subacromial bursectomy was performed using a  combination of the shaver and Arthrocare wand. The entire acromial undersurface was exposed and the CA ligament was subperiosteally elevated to expose the anterior acromial hook. A burr was used to create a flat anterior and lateral aspect of the acromion, converting it from a Type 2 to a Type 1 acromion. Care was made to keep the deltoid fascia intact.   Next I created an accessory posterolateral portal to assist with visualization and instrumentation.  I debrided the poor quality edges of the supraspinatus tendon.  This was a U-shaped tear of the entire supraspinatus.  I prepared the footprint using a burr to expose bleeding bone.    I then percutaneously placed 1 Iconix SPEED medial row anchor along the anterior portion of the tear at the articular margin. Another SPEED anchor was placed along the posterior portion of the tear at the articular margin. I then shuttled all 8 strands of tape through the rotator cuff just lateral to the musculotendinous junction using a suture passer spanning the anterior to posterior aspect of the tear. The posterior strands of each suture were passed through an [employer] anchor.  This was placed approximately 2 cm distal to the lateral edge of the footprint in line with the posterior aspect of the tear with appropriate tensioning of each suture prior to final fixation.  Similarly, the anterior strands of each suture were passed through another SwiveLock anchor along the anterior margin of the tear.  There 1 small dogear centrally.  The FiberWire sutures from the posterior lateral row anchor was passed through the dog ear and an arthroscopic knot was tied to reduce the dog ear.  This construct allowed for excellent reapproximation of the rotator cuff to its native footprint without undue tension.  Appropriate compression was achieved.  The repair was stable to external and internal rotation.   Fluid was evacuated from the shoulder, and the portals were closed with 3-0  Nylon. Xeroform was applied to the portals. A sterile dressing was applied, followed by a Polar Care sleeve and a SlingShot shoulder immobilizer/sling. The patient was awakened from anesthesia without difficulty and was transferred to the PACU in stable condition.    Of note, assistance from a PA was essential to performing the surgery.  PA was present for the entire surgery.  PA assisted with patient positioning, retraction, instrumentation, and wound closure. The surgery would have been more difficult and had longer operative time without PA assistance.    COMPLICATIONS: none   DISPOSITION: plan for discharge home after recovery in PACU     POSTOPERATIVE PLAN: Remain in sling (except hygiene and elbow/wrist/hand RoM exercises as instructed by PT) x 6 weeks and NWB for this time. PT to begin 3-4 days after surgery.  Rotator cuff repair rehab protocol. ASA 325mg  daily x 2 weeks for DVT ppx.

## 2020-03-11 NOTE — Anesthesia Procedure Notes (Signed)
Procedure Name: LMA Insertion Date/Time: 03/11/2020 7:44 AM Performed by: Cameron Ali, CRNA Pre-anesthesia Checklist: Patient identified, Emergency Drugs available, Suction available, Timeout performed and Patient being monitored Patient Re-evaluated:Patient Re-evaluated prior to induction Oxygen Delivery Method: Circle system utilized Preoxygenation: Pre-oxygenation with 100% oxygen Induction Type: IV induction LMA: LMA inserted LMA Size: 4.0 Number of attempts: 1 Placement Confirmation: positive ETCO2 and breath sounds checked- equal and bilateral Tube secured with: Tape Dental Injury: Teeth and Oropharynx as per pre-operative assessment

## 2020-03-11 NOTE — H&P (Signed)
Paper H&P to be scanned into permanent record. H&P reviewed. No significant changes noted.  

## 2020-03-11 NOTE — Anesthesia Preprocedure Evaluation (Addendum)
Anesthesia Evaluation  Patient identified by MRN, date of birth, ID band Patient awake    Reviewed: Allergy & Precautions, H&P , NPO status , Patient's Chart, lab work & pertinent test results, reviewed documented beta blocker date and time   Airway Mallampati: II  TM Distance: >3 FB Neck ROM: full    Dental no notable dental hx.    Pulmonary neg pulmonary ROS,    Pulmonary exam normal breath sounds clear to auscultation       Cardiovascular Exercise Tolerance: Good hypertension,  Rhythm:regular Rate:Normal     Neuro/Psych negative neurological ROS  negative psych ROS   GI/Hepatic Neg liver ROS, GERD  ,  Endo/Other  negative endocrine ROS  Renal/GU negative Renal ROS  negative genitourinary   Musculoskeletal   Abdominal   Peds  Hematology negative hematology ROS (+)   Anesthesia Other Findings   Reproductive/Obstetrics negative OB ROS                             Anesthesia Physical Anesthesia Plan  ASA: II  Anesthesia Plan: General LMA and Regional   Post-op Pain Management: GA combined w/ Regional for post-op pain   Induction:   PONV Risk Score and Plan: 3 and Dexamethasone, Ondansetron and Midazolam  Airway Management Planned:   Additional Equipment:   Intra-op Plan:   Post-operative Plan:   Informed Consent: I have reviewed the patients History and Physical, chart, labs and discussed the procedure including the risks, benefits and alternatives for the proposed anesthesia with the patient or authorized representative who has indicated his/her understanding and acceptance.     Dental Advisory Given  Plan Discussed with: CRNA  Anesthesia Plan Comments:        Anesthesia Quick Evaluation

## 2020-03-14 ENCOUNTER — Encounter: Payer: Self-pay | Admitting: Orthopedic Surgery

## 2020-09-11 IMAGING — CT CT ABDOMEN AND PELVIS WITHOUT AND WITH CONTRAST
3 of 12 series · 11 of 46 positions shown, 17 images · IV contrast (omnipaque)
Comparison: 09/21/2016 abdominal sonogram.

CLINICAL DATA: Intermittent microhematuria and dysuria, chronic.
History of urinary tract infections treated with antibiotics, most
recent 2 weeks prior.

EXAM:
CT ABDOMEN AND PELVIS WITHOUT AND WITH CONTRAST
TECHNIQUE: Multidetector CT imaging of the abdomen and pelvis was performed
following the standard protocol before and following the bolus
administration of intravenous contrast.
CONTRAST:  125mL OMNIPAQUE IOHEXOL 300 MG/ML  SOLN

[Series 5: coronal without pre · coronal · non-contrast · 0.67mm/px · 2 of 141 slices shown, 3 images]
[im 47/141  soft-tissue]
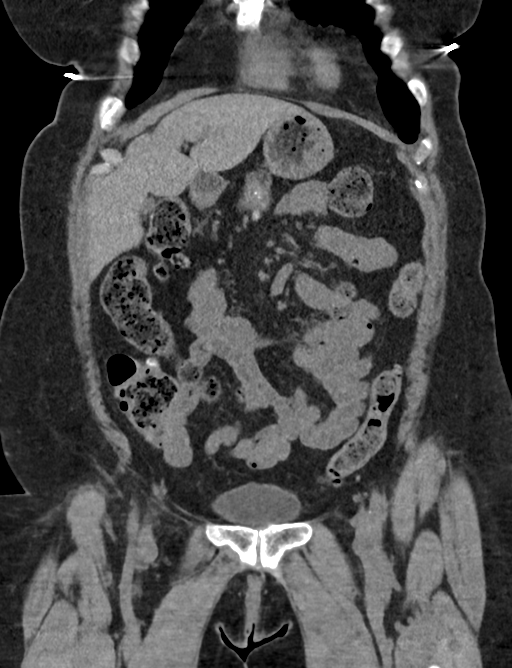
[im 47/141  bone]
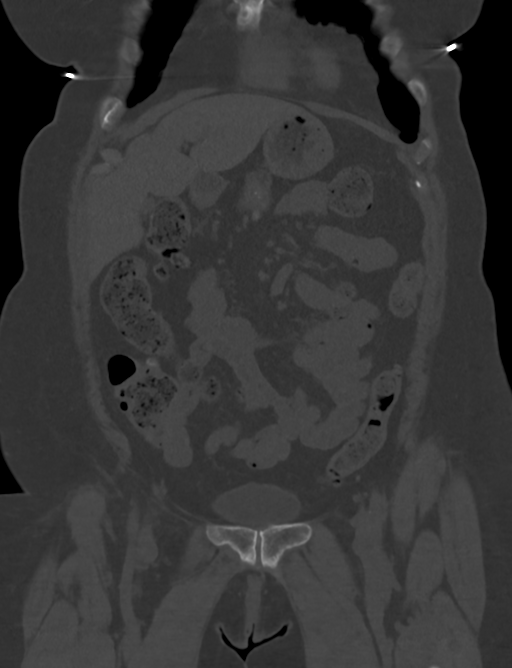
[im 94/141  soft-tissue]
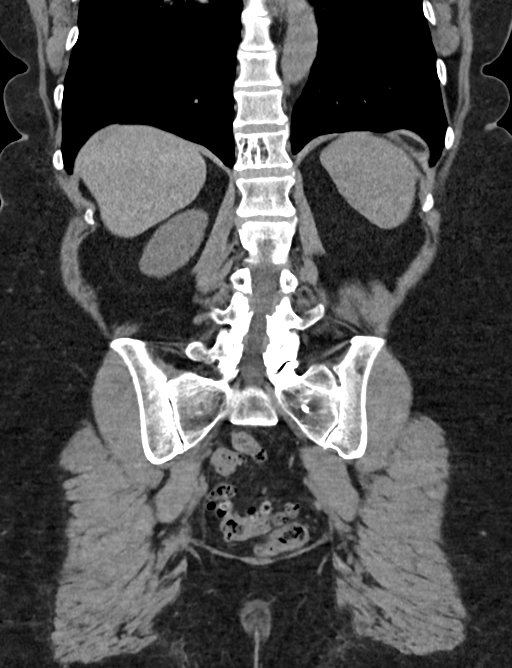

[Series 9: axial hematuria with · axial · 0.67mm/px · z∈[-1422,-1112]mm · 6 of 88 slices shown, 11 images]
[im 13/88  soft-tissue]
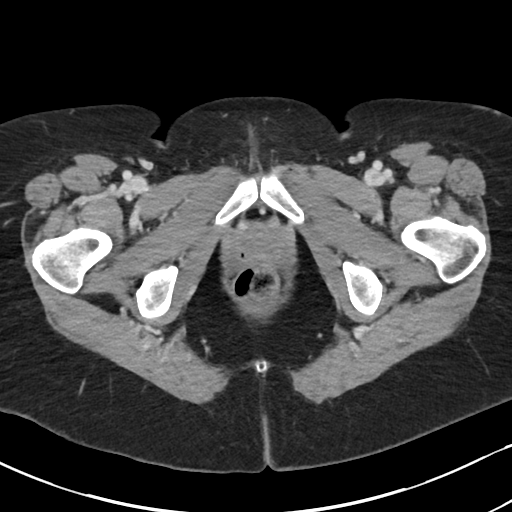
[im 13/88  bone]
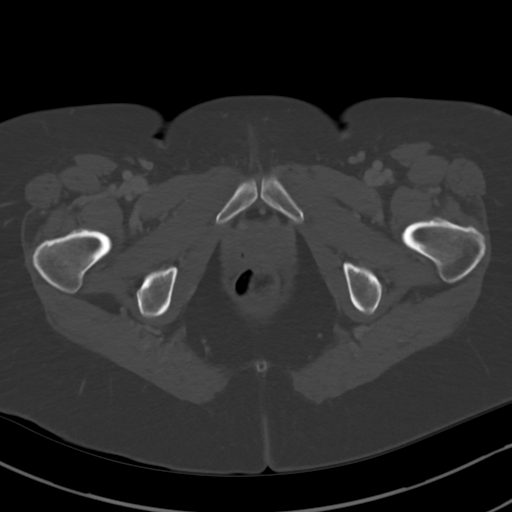
[im 25/88  soft-tissue]
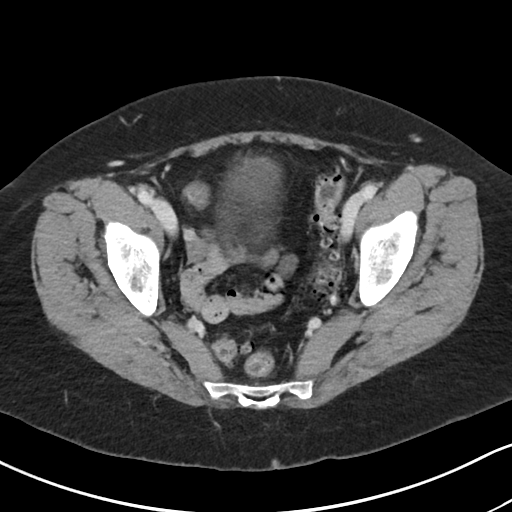
[im 38/88  soft-tissue]
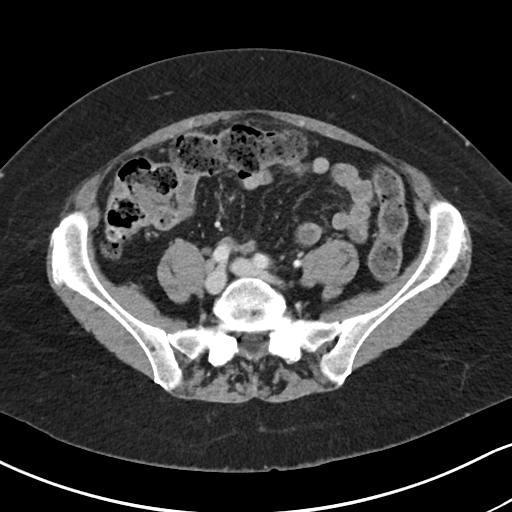
[im 38/88  lung]
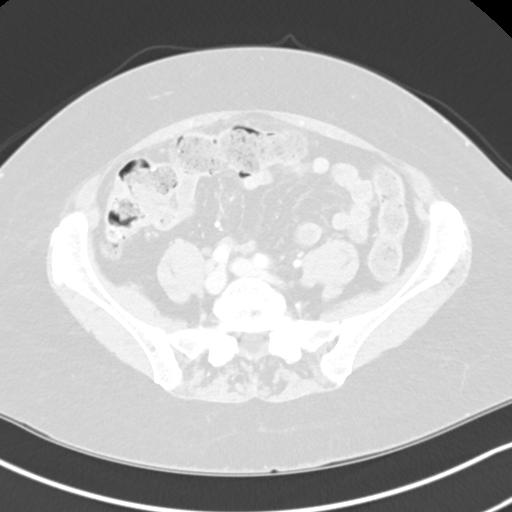
[im 50/88  soft-tissue]
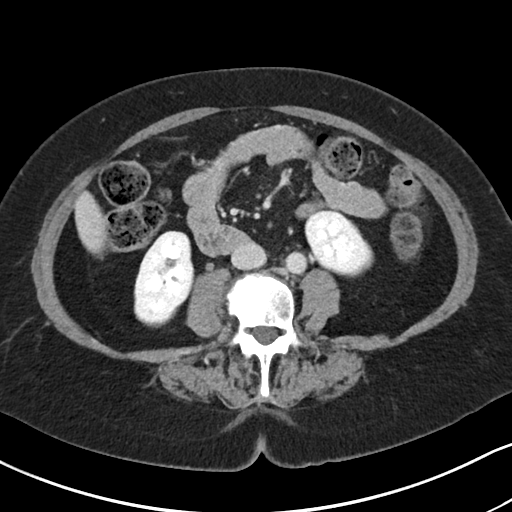
[im 50/88  lung]
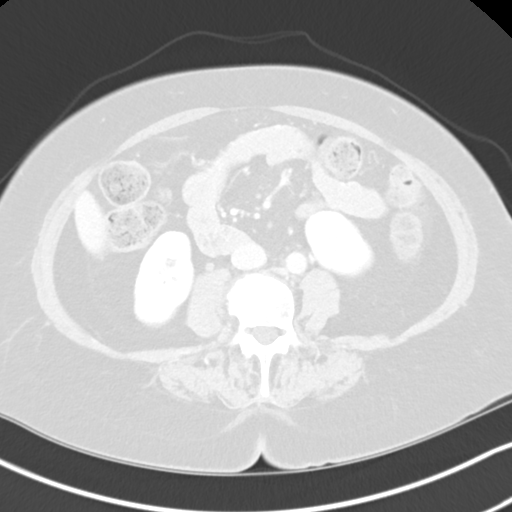
[im 63/88  soft-tissue]
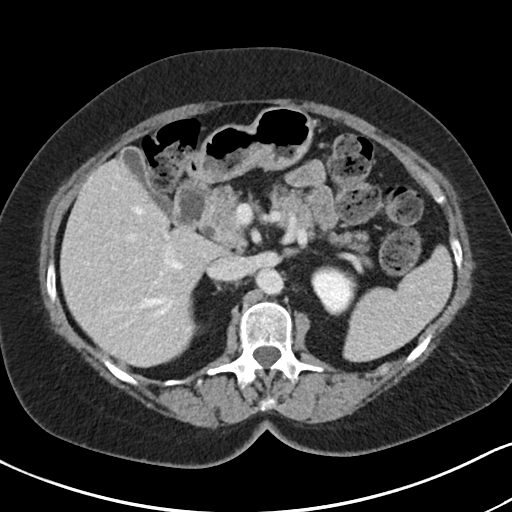
[im 63/88  lung]
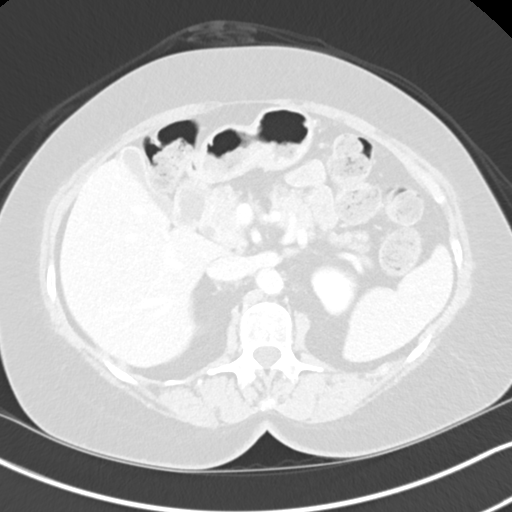
[im 75/88  soft-tissue]
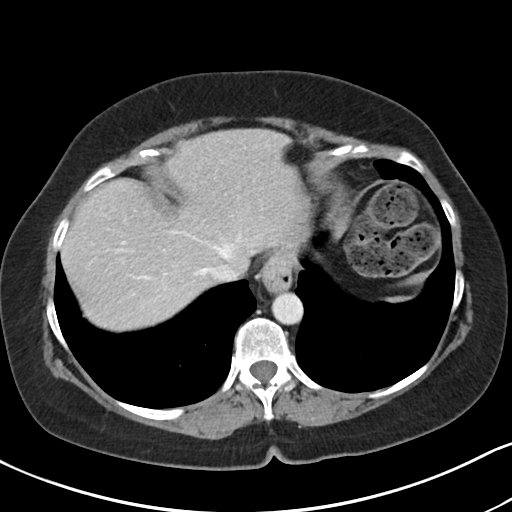
[im 75/88  lung]
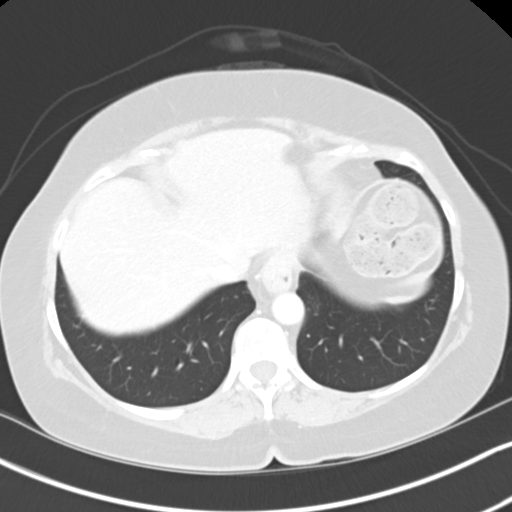

[Series 17: axial delay prone · axial · delayed · 0.67mm/px · z∈[-1454,-1324]mm · 3 of 91 slices shown]
[im 13/91  soft-tissue]
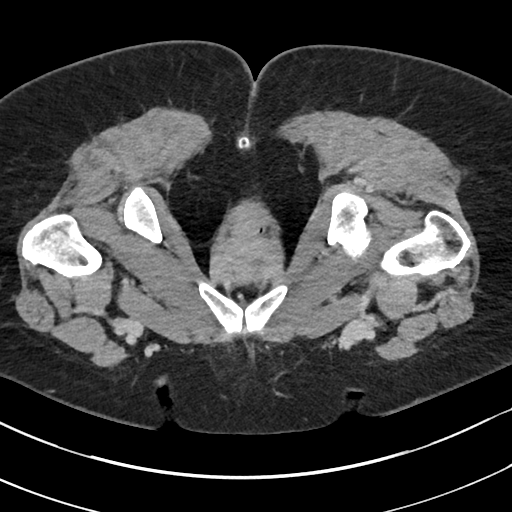
[im 26/91  soft-tissue]
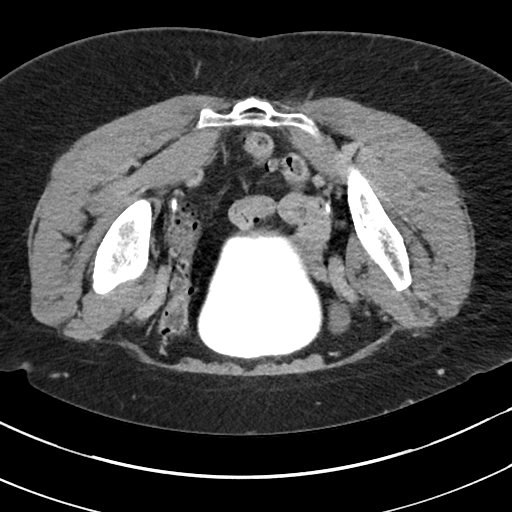
[im 39/91  soft-tissue]
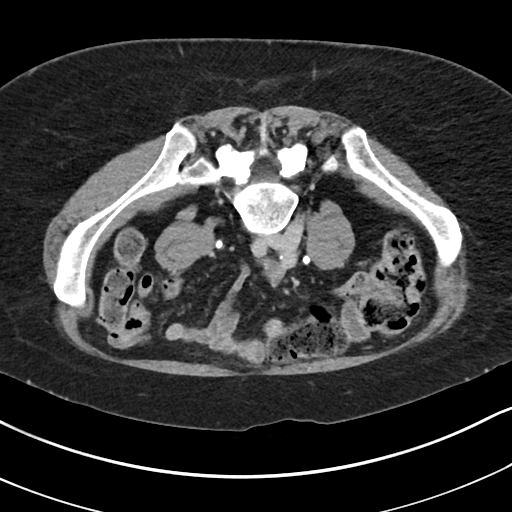

[11 of 46 positions shown; findings below may reference images not displayed]

FINDINGS: Lower chest: No significant pulmonary nodules or acute consolidative
airspace disease.

Hepatobiliary: Normal liver size. No liver mass. Normal gallbladder
with no radiopaque cholelithiasis. No biliary ductal dilatation.

Pancreas: Normal, with no mass or duct dilation.

Spleen: Normal size. No mass.

Adrenals/Urinary Tract: Normal adrenals. No renal stones. No
hydronephrosis. Two scattered subcentimeter hypodense renal cortical
lesions in the left kidney are too small to characterize and require
no follow-up. No suspicious renal cortical masses. Normal caliber
ureters. No ureteral stones. On delayed imaging, there is no
urothelial wall thickening and there are no filling defects in the
opacified portions of the bilateral collecting systems or ureters.
No bladder stones, wall thickening, masses or diverticula.

Stomach/Bowel: Small hiatal hernia. Otherwise normal nondistended
stomach. Normal caliber small bowel with no small bowel wall
thickening. Normal appendix. Moderate diffuse colonic
diverticulosis, with no large bowel wall thickening or significant
pericolonic fat stranding.

Vascular/Lymphatic: Atherosclerotic nonaneurysmal abdominal aorta.
Patent portal, splenic, hepatic and renal veins. No pathologically
enlarged lymph nodes in the abdomen or pelvis.

Reproductive: Status post hysterectomy, with no abnormal findings at
the vaginal cuff. Simple 1.2 cm right adnexal cyst (series 2/image
64). No left adnexal lesion.

Other: No pneumoperitoneum, ascites or focal fluid collection.

Musculoskeletal: No aggressive appearing focal osseous lesions.
Marked thoracolumbar spondylosis. T12 vertebral hemangioma.
IMPRESSION: 1. No CT findings to explain the hematuria. No urolithiasis. No
suspicious renal cortical masses. No evidence of urothelial lesions.
No hydronephrosis.
2. Small hiatal hernia.
3. Moderate diffuse colonic diverticulosis.
4. Simple 1.2 cm right adnexal cyst, for which no follow-up is
required. This recommendation follows ACR consensus guidelines:
White Paper of the ACR Incidental Findings Committee II on Adnexal
Findings. [HOSPITAL] [DATE].
5.  Aortic Atherosclerosis ([id number]).

## 2020-09-13 NOTE — Progress Notes (Signed)
09/16/20 8:16 AM   Tabitha Gonzales May 12, 1950 [id number]  Referring provider:  Idelle Crouch, MD [facility] [place],  [place] [address] Chief Complaint  Patient presents with   Hematuria    Follow up     HPI: Tabitha Gonzales is a 70 y.o.female who presents today for microscopic hematuria.   She was last seen 08/2018 for cystoscopy to further evaluate her microscopic hematuria. This cystoscopy was unremarkable and CT urogram was negative without significant GU pathology   Her recent urinalysis on 08/22/2020 showed microscopic hematuria, 4-10 RBC, rare bacteria and squamous epithelial cells.   She states her brother had a tumor in his bladder a few years ago.   Occasional urgency but no incontinence.  Minimal bother.    PMH: Past Medical History:  Diagnosis Date   Adenomatous polyp of ascending colon 02/21/2015   Arthritis    Family history of adverse reaction to anesthesia    sister - PONV   GERD (gastroesophageal reflux disease)    Hypercholesteremia    Hypertension    Osteopenia    PONV (postoperative nausea and vomiting)    Skin cancer, basal cell    Squamous cell skin cancer    Vertigo    rare    Surgical History: Past Surgical History:  Procedure Laterality Date   ABDOMINAL HYSTERECTOMY     BREAST CYST ASPIRATION Right 20 years ago   neg   COLONOSCOPY WITH PROPOFOL N/A 07/03/2016   Procedure: COLONOSCOPY WITH PROPOFOL;  Surgeon: Lollie Sails, MD;  Location: [facility] ENDOSCOPY;  Service: Endoscopy;  Laterality: N/A;   right shoulder rotater cuff     SHOULDER ARTHROSCOPY WITH ROTATOR CUFF REPAIR AND SUBACROMIAL DECOMPRESSION Left 03/11/2020   Procedure: Left shoulder arthroscopic rotator cuff repair and subacromial decompression - Reche Dixon to Assist;  Surgeon: Leim Fabry, MD;  Location: [facility];  Service: Orthopedics;  Laterality: Left;    Home Medications:  Allergies as of 09/14/2020       Reactions    Sulfa Antibiotics Itching        Medication List        Accurate as of September 14, 2020 11:59 PM. If you have any questions, ask your nurse or doctor.          STOP taking these medications    Biotin 10 MG Caps Stopped by: Hollice Espy, MD   multivitamin tablet Stopped by: Hollice Espy, MD   ondansetron 4 MG disintegrating tablet Commonly known as: Zofran ODT Stopped by: Hollice Espy, MD   oxyCODONE 5 MG immediate release tablet Commonly known as: Roxicodone Stopped by: Hollice Espy, MD       TAKE these medications    acetaminophen 500 MG tablet Commonly known as: TYLENOL Take 2 tablets (1,000 mg total) by mouth every 8 (eight) hours.   amLODipine 5 MG tablet Commonly known as: NORVASC Take 5 mg by mouth daily.   esomeprazole 40 MG capsule Commonly known as: NEXIUM Take 20 mg by mouth daily at 12 noon.   fluticasone 50 MCG/ACT nasal spray Commonly known as: FLONASE Place 1 spray into both nostrils daily.   losartan 50 MG tablet Commonly known as: COZAAR Take by mouth.   rosuvastatin 5 MG tablet Commonly known as: CRESTOR Take 5 mg by mouth daily.        Allergies:  Allergies  Allergen Reactions   Sulfa Antibiotics Itching    Family History: Family History  Problem Relation Age  of Onset   Breast cancer Cousin        pat cousin   Bladder Cancer Neg Hx    Prostate cancer Neg Hx    Kidney cancer Neg Hx     Social History:  reports that she has never smoked. She has never used smokeless tobacco. She reports that she does not drink alcohol and does not use drugs.   Physical Exam: BP 130/86   Pulse 77   Ht '5\' 2"'$  (1.575 m)   Wt 170 lb (77.1 kg)   BMI 31.09 kg/m   Constitutional:  Alert and oriented, No acute distress. HEENT: [place] AT, moist mucus membranes.  Trachea midline, no masses. Cardiovascular: No clubbing, cyanosis, or edema. Respiratory: Normal respiratory effort, no increased work of breathing. Skin: No rashes,  bruises or suspicious lesions. Neurologic: Grossly intact, no focal deficits, moving all 4 extremities. Psychiatric: Normal mood and affect.  Laboratory Data: Lab Results  Component Value Date   CREATININE 0.90 09/02/2018    Urinalysis Results for orders placed or performed in visit on 09/14/20  Microscopic Examination   Urine  Result Value Ref Range   WBC, UA 0-5 0 - 5 /hpf   RBC 3-10 (A) 0 - 2 /hpf   Epithelial Cells (non renal) 0-10 0 - 10 /hpf   Bacteria, UA None seen None seen/Few  Urinalysis, Complete  Result Value Ref Range   Specific Gravity, UA 1.010 1.005 - 1.030   pH, UA 6.5 5.0 - 7.5   Color, UA Yellow Yellow   Appearance Ur Clear Clear   Leukocytes,UA Negative Negative   Protein,UA Negative Negative/Trace   Glucose, UA Negative Negative   Ketones, UA Negative Negative   RBC, UA 1+ (A) Negative   Bilirubin, UA Negative Negative   Urobilinogen, Ur 0.2 0.2 - 1.0 mg/dL   Nitrite, UA Negative Negative   Microscopic Examination See below:      Assessment & Plan:    Microscopic hematuria  -Persistent, s/p negative work up 2 years ago -Recommend repeat modified hematuria work up - RUS  - cystoscopy to evaluate bladder    Follow-up with RUS/ cystoscopy   [employer] as a scribe for Hollice Espy, MD.,have documented all relevant documentation on the behalf of Hollice Espy, MD,as directed by  Hollice Espy, MD while in the presence of Hollice Espy, MD.  I have reviewed the above documentation for accuracy and completeness, and I agree with the above.   Hollice Espy, MD   [facility] [address] [place], [place] [address] [phone]

## 2020-09-14 ENCOUNTER — Other Ambulatory Visit: Payer: Self-pay

## 2020-09-14 ENCOUNTER — Encounter: Payer: Self-pay | Admitting: Urology

## 2020-09-14 ENCOUNTER — Ambulatory Visit: Payer: Medicare HMO | Admitting: Urology

## 2020-09-14 VITALS — BP 130/86 | HR 77 | Ht 62.0 in | Wt 170.0 lb

## 2020-09-14 DIAGNOSIS — R319 Hematuria, unspecified: Secondary | ICD-10-CM

## 2020-09-14 NOTE — Patient Instructions (Signed)
Cystoscopy Cystoscopy is a procedure that is used to help diagnose and sometimes treat conditions that affect the lower urinary tract. The lower urinary tract includes the bladder and the urethra. The urethra is the tube that drains urine from the bladder. Cystoscopy is done using a thin, tube-shaped instrument with a light and camera at the end (cystoscope). The cystoscope may be hard or flexible, depending on the goal of the procedure. The cystoscope is inserted through the urethra, into the bladder. Cystoscopy may be recommended if you have: Urinary tract infections that keep coming back. Blood in the urine (hematuria). An inability to control when you urinate (urinary incontinence) or an overactive bladder. Unusual cells found in a urine sample. A blockage in the urethra, such as a urinary stone. Painful urination. An abnormality in the bladder found during an intravenous pyelogram (IVP) or CT scan. Cystoscopy may also be done to remove a sample of tissue to be examined under a microscope (biopsy). What are the risks? Generally, this is a safe procedure. However, problems may occur, including: Infection. Bleeding.  What happens during the procedure?  You will be given one or more of the following: A medicine to numb the area (local anesthetic). The area around the opening of your urethra will be cleaned. The cystoscope will be passed through your urethra into your bladder. Germ-free (sterile) fluid will flow through the cystoscope to fill your bladder. The fluid will stretch your bladder so that your health care provider can clearly examine your bladder walls. Your doctor will look at the urethra and bladder. The cystoscope will be removed The procedure may vary among health care providers  What can I expect after the procedure? After the procedure, it is common to have: Some soreness or pain in your abdomen and urethra. Urinary symptoms. These include: Mild pain or burning when you  urinate. Pain should stop within a few minutes after you urinate. This may last for up to 1 week. A small amount of blood in your urine for several days. Feeling like you need to urinate but producing only a small amount of urine. Follow these instructions at home: General instructions Return to your normal activities as told by your health care provider.  Do not drive for 24 hours if you were given a sedative during your procedure. Watch for any blood in your urine. If the amount of blood in your urine increases, call your health care provider. If a tissue sample was removed for testing (biopsy) during your procedure, it is up to you to get your test results. Ask your health care provider, or the department that is doing the test, when your results will be ready. Drink enough fluid to keep your urine pale yellow. Keep all follow-up visits as told by your health care provider. This is important. Contact a health care provider if you: Have pain that gets worse or does not get better with medicine, especially pain when you urinate. Have trouble urinating. Have more blood in your urine. Get help right away if you: Have blood clots in your urine. Have abdominal pain. Have a fever or chills. Are unable to urinate. Summary Cystoscopy is a procedure that is used to help diagnose and sometimes treat conditions that affect the lower urinary tract. Cystoscopy is done using a thin, tube-shaped instrument with a light and camera at the end. After the procedure, it is common to have some soreness or pain in your abdomen and urethra. Watch for any blood in your urine.   If the amount of blood in your urine increases, call your health care provider. If you were prescribed an antibiotic medicine, take it as told by your health care provider. Do not stop taking the antibiotic even if you start to feel better. This information is not intended to replace advice given to you by your health care provider. Make  sure you discuss any questions you have with your health care provider. Document Revised: 01/07/2018 Document Reviewed: 01/07/2018 Elsevier Patient Education  2020 [employer]  

## 2020-09-15 LAB — URINALYSIS, COMPLETE
Bilirubin, UA: NEGATIVE
Glucose, UA: NEGATIVE
Ketones, UA: NEGATIVE
Leukocytes,UA: NEGATIVE
Nitrite, UA: NEGATIVE
Protein,UA: NEGATIVE
Specific Gravity, UA: 1.01 (ref 1.005–1.030)
Urobilinogen, Ur: 0.2 mg/dL (ref 0.2–1.0)
pH, UA: 6.5 (ref 5.0–7.5)

## 2020-09-15 LAB — MICROSCOPIC EXAMINATION: Bacteria, UA: NONE SEEN

## 2020-10-10 ENCOUNTER — Other Ambulatory Visit: Payer: Self-pay

## 2020-10-10 ENCOUNTER — Ambulatory Visit
Admission: RE | Admit: 2020-10-10 | Discharge: 2020-10-10 | Disposition: A | Discharge status: Home / Self Care | Payer: Medicare HMO | Source: Ambulatory Visit | Attending: Urology | Admitting: Urology

## 2020-10-10 DIAGNOSIS — R319 Hematuria, unspecified: Secondary | ICD-10-CM | POA: Insufficient documentation

## 2020-10-11 NOTE — Progress Notes (Signed)
   10/12/20  CC:  Chief Complaint  Patient presents with   Cysto     HPI: Tabitha Gonzales is a 70 y.o.female with a personal history of microscopic hematuria, who presents today for a cystoscopy   Her last cystoscopy in 08/2018 was unremarkable and CT urogram was negative without significant GU pathology.   She underwent a RUS on 09/30/2020 was negative.   She has a family history of bladder tumors.   Vitals:   10/12/20 0905  BP: 133/85  Pulse: 73  NED. A&Ox3.   No respiratory distress   Abd soft, NT, ND Normal external genitalia with patent urethral meatus  Cystoscopy Procedure Note  Patient identification was confirmed, informed consent was obtained, and patient was prepped using Betadine solution.  Lidocaine jelly was administered per urethral meatus.    Procedure: - Flexible cystoscope introduced, without any difficulty.   - Thorough search of the bladder revealed:    normal urethral meatus    normal urothelium    no stones    no ulcers     no tumors    no urethral polyps    no trabeculation  - Ureteral orifices were normal in position and appearance.  Post-Procedure: - Patient tolerated the procedure well   Assessment/ Plan:  Microscopic hematuria  - S/P negative work-up 2 years ago - RUS was negative  - Cystoscopy today was unremarkable with a normal bladder - Due to persistence if every two years there is still presence of microscopic blood in urine then follow-up with cystoscopy.     Return in about 2 years (around 10/13/2022) for 2year cysto.  I,Kailey Littlejohn,acting as a Education administrator for Hollice Espy, MD.,have documented all relevant documentation on the behalf of Hollice Espy, MD,as directed by  Hollice Espy, MD while in the presence of Hollice Espy, MD.  I have reviewed the above documentation for accuracy and completeness, and I agree with the above.   Hollice Espy, MD

## 2020-10-12 ENCOUNTER — Other Ambulatory Visit: Payer: Self-pay

## 2020-10-12 ENCOUNTER — Ambulatory Visit: Payer: Medicare HMO | Admitting: Urology

## 2020-10-12 ENCOUNTER — Encounter: Payer: Self-pay | Admitting: Urology

## 2020-10-12 VITALS — BP 133/85 | HR 73 | Ht 62.0 in | Wt 167.0 lb

## 2020-10-12 DIAGNOSIS — R319 Hematuria, unspecified: Secondary | ICD-10-CM

## 2020-10-12 LAB — URINALYSIS, COMPLETE
Bilirubin, UA: NEGATIVE
Glucose, UA: NEGATIVE
Ketones, UA: NEGATIVE
Leukocytes,UA: NEGATIVE
Nitrite, UA: NEGATIVE
Protein,UA: NEGATIVE
Specific Gravity, UA: 1.01 (ref 1.005–1.030)
Urobilinogen, Ur: 0.2 mg/dL (ref 0.2–1.0)
pH, UA: 6 (ref 5.0–7.5)

## 2020-10-12 LAB — MICROSCOPIC EXAMINATION: Bacteria, UA: NONE SEEN

## 2020-11-02 ENCOUNTER — Ambulatory Visit: Payer: Medicare HMO | Admitting: Physician Assistant

## 2021-03-07 ENCOUNTER — Other Ambulatory Visit: Payer: Self-pay | Admitting: Internal Medicine

## 2021-03-07 DIAGNOSIS — Z1231 Encounter for screening mammogram for malignant neoplasm of breast: Secondary | ICD-10-CM

## 2021-05-02 ENCOUNTER — Ambulatory Visit
Admission: RE | Admit: 2021-05-02 | Discharge: 2021-05-02 | Disposition: A | Discharge status: Home / Self Care | Payer: Medicare HMO | Source: Ambulatory Visit | Attending: Internal Medicine | Admitting: Internal Medicine

## 2021-05-02 DIAGNOSIS — Z1231 Encounter for screening mammogram for malignant neoplasm of breast: Secondary | ICD-10-CM | POA: Insufficient documentation

## 2021-10-10 ENCOUNTER — Other Ambulatory Visit: Payer: Self-pay

## 2021-10-10 ENCOUNTER — Encounter
Admission: RE | Disposition: A | Discharge status: Home / Self Care | Payer: Self-pay | Source: Home / Self Care | Attending: Gastroenterology

## 2021-10-10 ENCOUNTER — Ambulatory Visit: Payer: Medicare HMO | Admitting: Anesthesiology

## 2021-10-10 ENCOUNTER — Ambulatory Visit
Admission: RE | Admit: 2021-10-10 | Discharge: 2021-10-10 | Disposition: A | Discharge status: Home / Self Care | Payer: Medicare HMO | Attending: Gastroenterology | Admitting: Gastroenterology

## 2021-10-10 ENCOUNTER — Encounter: Payer: Self-pay | Admitting: Anesthesiology

## 2021-10-10 DIAGNOSIS — I10 Essential (primary) hypertension: Secondary | ICD-10-CM | POA: Insufficient documentation

## 2021-10-10 DIAGNOSIS — Z800 Family history of malignant neoplasm of digestive organs: Secondary | ICD-10-CM | POA: Insufficient documentation

## 2021-10-10 DIAGNOSIS — R1310 Dysphagia, unspecified: Secondary | ICD-10-CM | POA: Diagnosis present

## 2021-10-10 DIAGNOSIS — Z85828 Personal history of other malignant neoplasm of skin: Secondary | ICD-10-CM | POA: Insufficient documentation

## 2021-10-10 DIAGNOSIS — E785 Hyperlipidemia, unspecified: Secondary | ICD-10-CM | POA: Diagnosis not present

## 2021-10-10 DIAGNOSIS — Q453 Other congenital malformations of pancreas and pancreatic duct: Secondary | ICD-10-CM | POA: Diagnosis not present

## 2021-10-10 DIAGNOSIS — Z79899 Other long term (current) drug therapy: Secondary | ICD-10-CM | POA: Insufficient documentation

## 2021-10-10 DIAGNOSIS — Z1211 Encounter for screening for malignant neoplasm of colon: Secondary | ICD-10-CM | POA: Insufficient documentation

## 2021-10-10 DIAGNOSIS — K5730 Diverticulosis of large intestine without perforation or abscess without bleeding: Secondary | ICD-10-CM | POA: Insufficient documentation

## 2021-10-10 DIAGNOSIS — K219 Gastro-esophageal reflux disease without esophagitis: Secondary | ICD-10-CM | POA: Insufficient documentation

## 2021-10-10 DIAGNOSIS — K317 Polyp of stomach and duodenum: Secondary | ICD-10-CM | POA: Insufficient documentation

## 2021-10-10 DIAGNOSIS — D120 Benign neoplasm of cecum: Secondary | ICD-10-CM | POA: Diagnosis not present

## 2021-10-10 DIAGNOSIS — Z86010 Personal history of colonic polyps: Secondary | ICD-10-CM | POA: Insufficient documentation

## 2021-10-10 HISTORY — PX: ESOPHAGOGASTRODUODENOSCOPY (EGD) WITH PROPOFOL: SHX5813

## 2021-10-10 HISTORY — PX: COLONOSCOPY WITH PROPOFOL: SHX5780

## 2021-10-10 SURGERY — COLONOSCOPY WITH PROPOFOL
Anesthesia: General

## 2021-10-10 MED ORDER — SODIUM CHLORIDE 0.9 % IV SOLN: INTRAVENOUS | Status: DC

## 2021-10-10 MED ORDER — PROPOFOL 1000 MG/100ML IV EMUL
INTRAVENOUS | Status: AC
  Filled 2021-10-10: qty 100

## 2021-10-10 MED ORDER — EPHEDRINE SULFATE (PRESSORS) 50 MG/ML IJ SOLN
INTRAMUSCULAR | Status: DC | PRN
  Administered 2021-10-10: 10 mg via INTRAVENOUS

## 2021-10-10 MED ORDER — EPHEDRINE 5 MG/ML INJ
INTRAVENOUS | Status: AC
  Filled 2021-10-10: qty 5

## 2021-10-10 MED ORDER — PROPOFOL 500 MG/50ML IV EMUL
INTRAVENOUS | Status: DC | PRN
  Administered 2021-10-10: 150 ug/kg/min via INTRAVENOUS

## 2021-10-10 MED ORDER — LIDOCAINE HCL (CARDIAC) PF 100 MG/5ML IV SOSY
PREFILLED_SYRINGE | INTRAVENOUS | Status: DC | PRN
  Administered 2021-10-10: 50 mg via INTRAVENOUS

## 2021-10-10 NOTE — Interval H&P Note (Signed)
History and Physical Interval Note:  10/10/2021 12:46 PM  Tabitha Gonzales  has presented today for surgery, with the diagnosis of ESOPHAGEAL DYSPHAGIA,HX OF ADENOMATOUS POLYP OF COLON.  The various methods of treatment have been discussed with the patient and family. After consideration of risks, benefits and other options for treatment, the patient has consented to  Procedure(s): COLONOSCOPY WITH PROPOFOL (N/A) ESOPHAGOGASTRODUODENOSCOPY (EGD) WITH PROPOFOL (N/A) as a surgical intervention.  The patient's history has been reviewed, patient examined, no change in status, stable for surgery.  I have reviewed the patient's chart and labs.  Questions were answered to the patient's satisfaction.     Lesly Rubenstein  Ok to proceed with EGD/Colonoscopy

## 2021-10-10 NOTE — Op Note (Signed)
[facility] Patient Name: Tabitha Gonzales Procedure Date: 10/10/2021 12:38 PM MRN: [id number] Account #: [id number] Date of Birth: 05-Jun-1950 Admit Type: Outpatient Age: 71 Room: [facility] ENDO ROOM 1 Gender: Female Note Status: Finalized Instrument Name: Altamese Cabal Endoscope [id number] Procedure:             Upper GI endoscopy Indications:           Dysphagia Providers:             Andrey Farmer MD, MD Referring MD:          Leonie Douglas. Doy Hutching, MD (Referring MD) Medicines:             Monitored Anesthesia Care Complications:         No immediate complications. Estimated blood loss:                         Minimal. Procedure:             Pre-Anesthesia Assessment:                        - Prior to the procedure, a History and Physical was                         performed, and patient medications and allergies were                         reviewed. The patient is competent. The risks and                         benefits of the procedure and the sedation options and                         risks were discussed with the patient. All questions                         were answered and informed consent was obtained.                         Patient identification and proposed procedure were                         verified by the physician, the nurse, the                         anesthesiologist, the anesthetist and the technician                         in the endoscopy suite. Mental Status Examination:                         alert and oriented. Airway Examination: normal                         oropharyngeal airway and neck mobility. Respiratory                         Examination: clear to auscultation. CV Examination:  normal. Prophylactic Antibiotics: The patient does not                         require prophylactic antibiotics. Prior                         Anticoagulants: The patient has taken no previous                          anticoagulant or antiplatelet agents. ASA Grade                         Assessment: II - A patient with mild systemic disease.                         After reviewing the risks and benefits, the patient                         was deemed in satisfactory condition to undergo the                         procedure. The anesthesia plan was to use monitored                         anesthesia care (MAC). Immediately prior to                         administration of medications, the patient was                         re-assessed for adequacy to receive sedatives. The                         heart rate, respiratory rate, oxygen saturations,                         blood pressure, adequacy of pulmonary ventilation, and                         response to care were monitored throughout the                         procedure. The physical status of the patient was                         re-assessed after the procedure.                        After obtaining informed consent, the endoscope was                         passed under direct vision. Throughout the procedure,                         the patient's blood pressure, pulse, and oxygen                         saturations were monitored continuously. The Endoscope  was introduced through the mouth, and advanced to the                         second part of duodenum. The upper GI endoscopy was                         accomplished without difficulty. The patient tolerated                         the procedure well. Findings:      The examined esophagus was normal.      A single umbilicated lesion measuring 2 mm in diameter was found in the       gastric antrum. Biopsies were taken with a cold forceps for histology.       Estimated blood loss was minimal.      Multiple 2 to 7 mm sessile fundic gland polyps with no bleeding and no       stigmata of recent bleeding were found in the gastric fundus and in the       gastric  body. Biopsies were taken with a cold forceps for histology.       Estimated blood loss was minimal.      The examined duodenum was normal. Impression:            - Normal esophagus.                        - A single lesion diagnostic of aberrant pancreas was                         found in the stomach. Biopsied.                        - Multiple fundic gland polyps. Biopsied.                        - Normal examined duodenum. Recommendation:        - Discharge patient to home.                        - Resume previous diet.                        - Continue present medications.                        - Await pathology results.                        - Return to referring physician as previously                         scheduled. 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 All rights reserved. The codes documented in this report are preliminary and upon coder review may  be revised to meet current compliance requirements. Andrey Farmer MD, MD 10/10/2021 1:40:09 PM Number of Addenda: 0 Note Initiated On: 10/10/2021 12:38 PM Estimated Blood Loss:  Estimated blood loss was minimal.      [facility]

## 2021-10-10 NOTE — Anesthesia Postprocedure Evaluation (Signed)
Anesthesia Post Note  Patient: Tabitha Gonzales  Procedure(s) Performed: COLONOSCOPY WITH PROPOFOL ESOPHAGOGASTRODUODENOSCOPY (EGD) WITH PROPOFOL  Patient location during evaluation: Endoscopy Anesthesia Type: General Level of consciousness: awake and alert Pain management: pain level controlled Vital Signs Assessment: post-procedure vital signs reviewed and stable Respiratory status: spontaneous breathing, nonlabored ventilation, respiratory function stable and patient connected to nasal cannula oxygen Cardiovascular status: blood pressure returned to baseline and stable Postop Assessment: no apparent nausea or vomiting Anesthetic complications: no   No notable events documented.   Last Vitals:  Vitals:   10/10/21 1350 10/10/21 1358  BP:  119/79  Pulse: 81 77  Resp: 19 15  Temp:    SpO2: 100% 99%    Last Pain:  Vitals:   10/10/21 1330  TempSrc: Temporal  PainSc:                  Martha Clan

## 2021-10-10 NOTE — Anesthesia Procedure Notes (Signed)
Date/Time: 10/10/2021 1:00 PM  Performed by: Donalda Ewings, CindyPre-anesthesia Checklist: Patient identified, Emergency Drugs available, Suction available, Patient being monitored and Timeout performed Patient Re-evaluated:Patient Re-evaluated prior to induction Oxygen Delivery Method: Nasal cannula Preoxygenation: Pre-oxygenation with 100% oxygen Induction Type: IV induction Airway Equipment and Method: Bite block Placement Confirmation: positive ETCO2 and CO2 detector

## 2021-10-10 NOTE — H&P (Signed)
Outpatient short stay form Pre-procedure 10/10/2021  Lesly Rubenstein, MD  Primary Physician: Idelle Crouch, MD  Reason for visit:  Dysphagia/Surveillance colonoscopy  History of present illness:    71 y/o lady with history of hypertension, HLD, and GERD here for EGD/Colonoscopy for mild dysphagia and GERD and history of colon polyps. No blood thinners. Multiple 2nd degree relatives with colon cancer. History of hysterectomy.   No current facility-administered medications for this encounter.  Medications Prior to Admission  Medication Sig Dispense Refill Last Dose   amLODipine (NORVASC) 5 MG tablet Take 5 mg by mouth daily.   10/10/2021   esomeprazole (NEXIUM) 40 MG capsule Take 20 mg by mouth daily at 12 noon.   10/09/2021   losartan (COZAAR) 50 MG tablet Take 50 mg by mouth daily.   10/10/2021   rosuvastatin (CRESTOR) 5 MG tablet Take 5 mg by mouth daily.   Past Week   fluticasone (FLONASE) 50 MCG/ACT nasal spray Place 1 spray into both nostrils daily.      losartan (COZAAR) 50 MG tablet Take by mouth.        Allergies  Allergen Reactions   Sulfa Antibiotics Itching     Past Medical History:  Diagnosis Date   Adenomatous polyp of ascending colon 02/21/2015   Arthritis    Family history of adverse reaction to anesthesia    sister - PONV   GERD (gastroesophageal reflux disease)    Hypercholesteremia    Hypertension    Osteopenia    PONV (postoperative nausea and vomiting)    Skin cancer, basal cell    Squamous cell skin cancer    Vertigo    rare    Review of systems:  Otherwise negative.    Physical Exam  Gen: Alert, oriented. Appears stated age.  HEENT: PERRLA. Lungs: No respiratory distress  CV: RRR Abd: soft, benign, no masses Ext: No edema    Planned procedures: Proceed with EGD/colonoscopy. The patient understands the nature of the planned procedure, indications, risks, alternatives and potential complications including but not limited to bleeding,  infection, perforation, damage to internal organs and possible oversedation/side effects from anesthesia. The patient agrees and gives consent to proceed.  Please refer to procedure notes for findings, recommendations and patient disposition/instructions.     Lesly Rubenstein, MD [facility]

## 2021-10-10 NOTE — Op Note (Signed)
[facility] Patient Name: Tabitha Gonzales Procedure Date: 10/10/2021 12:38 PM MRN: [id number] Account #: [id number] Date of Birth: [birth year] Admit Type: Outpatient Age: 71 Room: [facility] ENDO ROOM 1 Gender: Female Note Status: Finalized Instrument Name: Jasper Riling [id number] Procedure:             Colonoscopy Indications:           High risk colon cancer surveillance: Personal history                         of adenoma (10 mm or greater in size), Last                         colonoscopy 5 years ago Providers:             Andrey Farmer MD, MD Referring MD:          Leonie Douglas. Doy Hutching, MD (Referring MD) Medicines:             Monitored Anesthesia Care Complications:         No immediate complications. Estimated blood loss:                         Minimal. Procedure:             Pre-Anesthesia Assessment:                        - Prior to the procedure, a History and Physical was                         performed, and patient medications and allergies were                         reviewed. The patient is competent. The risks and                         benefits of the procedure and the sedation options and                         risks were discussed with the patient. All questions                         were answered and informed consent was obtained.                         Patient identification and proposed procedure were                         verified by the physician, the nurse, the                         anesthesiologist, the anesthetist and the technician                         in the endoscopy suite. Mental Status Examination:                         alert and oriented. Airway Examination: normal  oropharyngeal airway and neck mobility. Respiratory                         Examination: clear to auscultation. CV Examination:                         normal. Prophylactic Antibiotics: The patient does not                          require prophylactic antibiotics. Prior                         Anticoagulants: The patient has taken no previous                         anticoagulant or antiplatelet agents. ASA Grade                         Assessment: II - A patient with mild systemic disease.                         After reviewing the risks and benefits, the patient                         was deemed in satisfactory condition to undergo the                         procedure. The anesthesia plan was to use monitored                         anesthesia care (MAC). Immediately prior to                         administration of medications, the patient was                         re-assessed for adequacy to receive sedatives. The                         heart rate, respiratory rate, oxygen saturations,                         blood pressure, adequacy of pulmonary ventilation, and                         response to care were monitored throughout the                         procedure. The physical status of the patient was                         re-assessed after the procedure.                        After obtaining informed consent, the colonoscope was                         passed under direct vision. Throughout the procedure,  the patient's blood pressure, pulse, and oxygen                         saturations were monitored continuously. The                         Colonoscope was introduced through the anus and                         advanced to the the cecum, identified by appendiceal                         orifice and ileocecal valve. The colonoscopy was                         technically difficult and complex due to restricted                         mobility of the colon and significant looping.                         Successful completion of the procedure was aided by                         withdrawing the scope and replacing with the pediatric                          colonoscope and applying abdominal pressure. The                         patient tolerated the procedure well. The quality of                         the bowel preparation was good. Findings:      The perianal and digital rectal examinations were normal.      A 4 mm polyp was found in the cecum. The polyp was sessile. The polyp       was removed with a cold snare. Resection and retrieval were complete.       Estimated blood loss was minimal.      Many small and large-mouthed diverticula were found in the sigmoid       colon, descending colon and ascending colon.      The exam was otherwise without abnormality on direct and retroflexion       views. Impression:            - One 4 mm polyp in the cecum, removed with a cold                         snare. Resected and retrieved.                        - Diverticulosis in the sigmoid colon, in the                         descending colon and in the ascending colon.                        - The examination was  otherwise normal on direct and                         retroflexion views. Recommendation:        - Discharge patient to home.                        - Resume previous diet.                        - Continue present medications.                        - Await pathology results.                        - Repeat colonoscopy is not recommended due to current                         age (66 years or older) for surveillance.                        - Return to referring physician as previously                         scheduled. Procedure Code(s):     --- Professional ---                        [phone], Colonoscopy, flexible; with removal of                         tumor(s), polyp(s), or other lesion(s) by snare                         technique Diagnosis Code(s):     --- Professional ---                        Z86.010, Personal history of colonic polyps                        K63.5, Polyp of colon                        K57.30, Diverticulosis  of large intestine without                         perforation or abscess without bleeding CPT copyright 2019 American Medical Association. All rights reserved. The codes documented in this report are preliminary and upon coder review may  be revised to meet current compliance requirements. Andrey Farmer MD, MD 10/10/2021 1:44:20 PM Number of Addenda: 0 Note Initiated On: 10/10/2021 12:38 PM Scope Withdrawal Time: 0 hours 10 minutes 43 seconds  Total Procedure Duration: 0 hours 26 minutes 54 seconds  Estimated Blood Loss:  Estimated blood loss was minimal.      [facility]

## 2021-10-10 NOTE — Anesthesia Preprocedure Evaluation (Addendum)
Anesthesia Evaluation  Patient identified by MRN, date of birth, ID band Patient awake    Reviewed: Allergy & Precautions, NPO status , Patient's Chart, lab work & pertinent test results  Airway Mallampati: II  TM Distance: >3 FB Neck ROM: full    Dental no notable dental hx.    Pulmonary neg pulmonary ROS,    Pulmonary exam normal        Cardiovascular Exercise Tolerance: Good hypertension, Pt. on medications Normal cardiovascular exam     Neuro/Psych negative neurological ROS  negative psych ROS   GI/Hepatic Neg liver ROS, GERD  Controlled,  Endo/Other  negative endocrine ROS  Renal/GU negative Renal ROS  negative genitourinary   Musculoskeletal   Abdominal Normal abdominal exam  (+)   Peds  Hematology negative hematology ROS (+)   Anesthesia Other Findings Past Medical History: 02/21/2015: Adenomatous polyp of ascending colon No date: Arthritis No date: Family history of adverse reaction to anesthesia     Comment:  sister - PONV No date: GERD (gastroesophageal reflux disease) No date: Hypercholesteremia No date: Hypertension No date: Osteopenia No date: PONV (postoperative nausea and vomiting) No date: Skin cancer, basal cell No date: Squamous cell skin cancer No date: Vertigo     Comment:  rare  Past Surgical History: No date: ABDOMINAL HYSTERECTOMY 20 years ago: BREAST CYST ASPIRATION; Right     Comment:  neg 07/03/2016: COLONOSCOPY WITH PROPOFOL; N/A     Comment:  Procedure: COLONOSCOPY WITH PROPOFOL;  Surgeon:               Lollie Sails, MD;  Location: [facility] ENDOSCOPY;                Service: Endoscopy;  Laterality: N/A; No date: right shoulder rotater cuff 03/11/2020: SHOULDER ARTHROSCOPY WITH ROTATOR CUFF REPAIR AND  SUBACROMIAL DECOMPRESSION; Left     Comment:  Procedure: Left shoulder arthroscopic rotator cuff               repair and subacromial decompression - Reche Dixon to                Assist;  Surgeon: Leim Fabry, MD;  Location: [place];  Service: Orthopedics;  Laterality: Left;     Reproductive/Obstetrics negative OB ROS                            Anesthesia Physical Anesthesia Plan  ASA: 2  Anesthesia Plan: General   Post-op Pain Management: Minimal or no pain anticipated   Induction: Intravenous  PONV Risk Score and Plan: Propofol infusion and TIVA  Airway Management Planned: Natural Airway  Additional Equipment:   Intra-op Plan:   Post-operative Plan:   Informed Consent: I have reviewed the patients History and Physical, chart, labs and discussed the procedure including the risks, benefits and alternatives for the proposed anesthesia with the patient or authorized representative who has indicated his/her understanding and acceptance.     Dental Advisory Given  Plan Discussed with: Anesthesiologist, CRNA and Surgeon  Anesthesia Plan Comments:        Anesthesia Quick Evaluation

## 2021-10-10 NOTE — Transfer of Care (Signed)
Immediate Anesthesia Transfer of Care Note  Patient: Tabitha Gonzales  Procedure(s) Performed: COLONOSCOPY WITH PROPOFOL ESOPHAGOGASTRODUODENOSCOPY (EGD) WITH PROPOFOL  Patient Location: PACU  Anesthesia Type:General  Level of Consciousness: awake and sedated  Airway & Oxygen Therapy: Patient Spontanous Breathing and Patient connected to nasal cannula oxygen  Post-op Assessment: Report given to RN and Post -op Vital signs reviewed and stable  Post vital signs: Reviewed and stable  Last Vitals:  Vitals Value Taken Time  BP    Temp    Pulse    Resp    SpO2      Last Pain:  Vitals:   10/10/21 1245  TempSrc: Temporal  PainSc: 0-No pain         Complications: No notable events documented.

## 2021-10-11 ENCOUNTER — Encounter: Payer: Self-pay | Admitting: Gastroenterology

## 2021-10-11 LAB — SURGICAL PATHOLOGY

## 2021-11-27 ENCOUNTER — Encounter (INDEPENDENT_AMBULATORY_CARE_PROVIDER_SITE_OTHER): Payer: Self-pay

## 2022-03-15 ENCOUNTER — Other Ambulatory Visit: Payer: Self-pay | Admitting: Internal Medicine

## 2022-03-15 DIAGNOSIS — Z1231 Encounter for screening mammogram for malignant neoplasm of breast: Secondary | ICD-10-CM

## 2022-05-08 ENCOUNTER — Ambulatory Visit
Admission: RE | Admit: 2022-05-08 | Discharge: 2022-05-08 | Disposition: A | Discharge status: Home / Self Care | Payer: Medicare HMO | Source: Ambulatory Visit | Attending: Internal Medicine | Admitting: Internal Medicine

## 2022-05-08 DIAGNOSIS — Z1231 Encounter for screening mammogram for malignant neoplasm of breast: Secondary | ICD-10-CM

## 2022-09-14 ENCOUNTER — Encounter: Payer: Self-pay | Admitting: Internal Medicine

## 2022-09-21 ENCOUNTER — Other Ambulatory Visit: Payer: Self-pay | Admitting: Internal Medicine

## 2022-09-21 DIAGNOSIS — R519 Headache, unspecified: Secondary | ICD-10-CM

## 2022-09-24 NOTE — Progress Notes (Signed)
09/25/2022 10:57 AM   Andreas Ohm [birth year] [id number]  Referring provider: Marguarite Arbour, MD [address] [facility] [place],  [place] [address]  Urological history: 1. High risk hematuria -non-smoker -CTU (2020) - Tabitha Gonzales worrisome GU findings -RUS (2022) - Tabitha Gonzales worrisome GU findings  -cysto (2020) - NED -cysto (2022) - NED   2. GSM  Chief Complaint  Patient presents with   Hematuria   HPI: Tabitha Gonzales is a 72 y.o. female who presents today for follow up.   Previous records reviewed. UA's  at PCP's office w/ persistent RBC's  She is having 1-7 daytime urinations, with 1-2 episodes of nocturia with a mild urge to urinate.  She does not have any urinary leakage.  She does not limit fluid intake.  She does engage in toilet mapping.    Patient denies any modifying or aggravating factors.  Patient denies any recent UTI's, gross hematuria, dysuria or suprapubic/flank pain.  Patient denies any fevers, chills, nausea or vomiting.   UA yellow clear, specific gravity 1.015, 2+ blood, pH 6.0, 1+ leukocytes, 6-10 WBCs, 11-30 RBCs, 0-10 epithelial cells and few bacteria.  PVR 0 mL    PMH: Past Medical History:  Diagnosis Date   Adenomatous polyp of ascending colon 02/21/2015   Arthritis    Family history of adverse reaction to anesthesia    sister - PONV   GERD (gastroesophageal reflux disease)    Hypercholesteremia    Hypertension    Osteopenia    PONV (postoperative nausea and vomiting)    Skin cancer, basal cell    Squamous cell skin cancer    Vertigo    rare    Surgical History: Past Surgical History:  Procedure Laterality Date   ABDOMINAL HYSTERECTOMY     BREAST CYST ASPIRATION Right 20 years ago   neg   COLONOSCOPY WITH PROPOFOL N/A 07/03/2016   Procedure: COLONOSCOPY WITH PROPOFOL;  Surgeon: Christena Deem, MD;  Location: [facility] ENDOSCOPY;  Service: Endoscopy;  Laterality: N/A;   COLONOSCOPY WITH PROPOFOL N/A 10/10/2021    Procedure: COLONOSCOPY WITH PROPOFOL;  Surgeon: Regis Bill, MD;  Location: [facility] ENDOSCOPY;  Service: Endoscopy;  Laterality: N/A;   ESOPHAGOGASTRODUODENOSCOPY (EGD) WITH PROPOFOL N/A 10/10/2021   Procedure: ESOPHAGOGASTRODUODENOSCOPY (EGD) WITH PROPOFOL;  Surgeon: Regis Bill, MD;  Location: [facility] ENDOSCOPY;  Service: Endoscopy;  Laterality: N/A;   right shoulder rotater cuff     SHOULDER ARTHROSCOPY WITH ROTATOR CUFF REPAIR AND SUBACROMIAL DECOMPRESSION Left 03/11/2020   Procedure: Left shoulder arthroscopic rotator cuff repair and subacromial decompression - Dedra Skeens to Assist;  Surgeon: Signa Kell, MD;  Location: [facility];  Service: Orthopedics;  Laterality: Left;    Home Medications:  Allergies as of 09/25/2022       Reactions   Sulfa Antibiotics Itching        Medication List        Accurate as of September 25, 2022 11:59 PM. If you have any questions, ask your nurse or doctor.          amLODipine 5 MG tablet Commonly known as: NORVASC Take 5 mg by mouth daily.   esomeprazole 40 MG capsule Commonly known as: NEXIUM Take 20 mg by mouth daily at 12 noon.   fluticasone 50 MCG/ACT nasal spray Commonly known as: FLONASE Place 1 spray into both nostrils daily.   losartan 50 MG tablet Commonly known as: COZAAR Take 50 mg by mouth daily. What changed: Another medication with  the same name was removed. Continue taking this medication, and follow the directions you see here. Changed by: Michiel Cowboy   rosuvastatin 5 MG tablet Commonly known as: CRESTOR Take 5 mg by mouth daily.        Allergies:  Allergies  Allergen Reactions   Sulfa Antibiotics Itching    Family History: Family History  Problem Relation Age of Onset   Breast cancer Cousin        pat cousin   Bladder Cancer Neg Hx    Prostate cancer Neg Hx    Kidney cancer Neg Hx     Social History:  reports that she has never smoked. She has never used smokeless tobacco. She  reports current alcohol use. She reports that she does not use drugs.  ROS: Pertinent ROS in HPI  Physical Exam: BP 122/70   Pulse 80   Wt 175 lb 3.2 oz (79.5 kg)   BMI 32.04 kg/m   Constitutional:  Well nourished. Alert and oriented, Tabitha Gonzales acute distress. HEENT: [place] AT, moist mucus membranes.  Trachea midline Cardiovascular: Tabitha Gonzales clubbing, cyanosis, or edema. Respiratory: Normal respiratory effort, Tabitha Gonzales increased work of breathing. Neurologic: Grossly intact, Tabitha Gonzales focal deficits, moving all 4 extremities. Psychiatric: Normal mood and affect.  Laboratory Data: Hemoglobin A1C Order: [id number] Component Ref Range & Units 2 wk ago  Hemoglobin A1C 4.2 - 5.6 % 5.9 High   Average Blood Glucose (Calc) mg/dL 295  Resulting Agency [facility]  Narrative Performed by [facility] Normal Range:    4.2 - 5.6% Increased Risk:  5.7 - 6.4% Diabetes:        >= 6.5% Glycemic Control for adults with diabetes:  <7%    Specimen Collected: 09/07/22 09:34   Performed by: [facility] Last Resulted: 09/07/22 13:45  Received From: [facility]  Result Received: 09/14/22 11:17    Urinalysis Urinalysis w/Microscopic Order: [id number] Component Ref Range & Units 2 wk ago  Color Colorless, Straw, Light Yellow, Yellow, Dark Yellow Colorless  Clarity Clear Clear  Specific Gravity 1.005 - 1.030 1.005  pH, Urine 5.0 - 8.0 7.0  Protein, Urinalysis Negative mg/dL Negative  Glucose, Urinalysis Negative mg/dL Negative  Ketones, Urinalysis Negative mg/dL Negative  Blood, Urinalysis Negative 1+ Abnormal   Nitrite, Urinalysis Negative Negative  Leukocyte Esterase, Urinalysis Negative 1+ Abnormal   Bilirubin, Urinalysis Negative Negative  Urobilinogen, Urinalysis 0.2 - 1.0 mg/dL 0.2  WBC, UA <=5 /hpf 4  Red Blood Cells, Urinalysis <=3 /hpf 4 High   Bacteria, Urinalysis 0 - 5 /hpf 0-5  Squamous Epithelial Cells, Urinalysis /hpf 0   Resulting Agency [facility]   Specimen Collected: 09/07/22 09:34   Performed by: [facility] Last Resulted: 09/07/22 10:17  Received From: [facility]  Result Received: 09/14/22 11:17   Urinalysis w/Microscopic Order: [id number] Component Ref Range & Units 3 mo ago  Color Colorless, Straw, Light Yellow, Yellow, Dark Yellow Yellow  Clarity Clear Clear  Specific Gravity 1.000 - 1.030 1.015  pH, Urine 5.0 - 8.0 7.5  Protein, Urinalysis Negative, Trace mg/dL Negative  Glucose, Urinalysis Negative mg/dL Negative  Ketones, Urinalysis Negative mg/dL Negative  Blood, Urinalysis Negative Small Abnormal   Nitrite, Urinalysis Negative Negative  Leukocyte Esterase, Urinalysis Negative Negative  White Blood Cells, Urinalysis None Seen, 0-3 /hpf 0-3  Red Blood Cells, Urinalysis None Seen, 0-3 /hpf 4-10 Abnormal   Bacteria, Urinalysis None Seen /hpf  Few Abnormal   Squamous Epithelial Cells, Urinalysis Rare, Few, None Seen /hpf Rare  Resulting Agency [facility]   Specimen Collected: 06/06/22 09:19   Performed by: [facility] Last Resulted: 06/06/22 11:05  Received From: [facility]  Result Received: 09/14/22 14:55   Urinalysis w/Microscopic Order: [id number] Component Ref Range & Units 6 mo ago  Color Colorless, Straw, Light Yellow, Yellow, Dark Yellow Yellow  Clarity Clear Clear  Specific Gravity 1.000 - 1.030 1.020  pH, Urine 5.0 - 8.0 7.0  Protein, Urinalysis Negative, Trace mg/dL Negative  Glucose, Urinalysis Negative mg/dL Negative  Ketones, Urinalysis Negative mg/dL Negative  Blood, Urinalysis Negative Moderate Abnormal   Nitrite, Urinalysis Negative Negative  Leukocyte Esterase, Urinalysis Negative Trace Abnormal   White Blood Cells, Urinalysis None Seen, 0-3 /hpf 0-3  Red Blood Cells, Urinalysis None Seen, 0-3 /hpf 4-10 Abnormal   Bacteria,  Urinalysis None Seen /hpf Few Abnormal   Squamous Epithelial Cells, Urinalysis Rare, Few, None Seen /hpf Rare  Resulting Agency [facility] - [facility]   Specimen Collected: 03/07/22 08:43   Performed by: [facility] Last Resulted: 03/07/22 09:52  Received From: [facility]  Result Received: 03/15/22 11:41   Results for orders placed or performed in visit on 09/25/22  Microscopic Examination   Urine  Result Value Ref Range   WBC, UA 6-10 (A) 0 - 5 /hpf   RBC, Urine 11-30 (A) 0 - 2 /hpf   Epithelial Cells (non renal) 0-10 0 - 10 /hpf   Bacteria, UA Few None seen/Few  Urinalysis, Complete  Result Value Ref Range   Specific Gravity, UA 1.015 1.005 - 1.030   pH, UA 6.0 5.0 - 7.5   Color, UA Yellow Yellow   Appearance Ur Clear Clear   Leukocytes,UA 1+ (A) Negative   Protein,UA Negative Negative/Trace   Glucose, UA Negative Negative   Ketones, UA Negative Negative   RBC, UA 2+ (A) Negative   Bilirubin, UA Negative Negative   Urobilinogen, Ur 0.2 0.2 - 1.0 mg/dL   Nitrite, UA Negative Negative   Microscopic Examination See below:   Bladder Scan (Post Void Residual) in office  Result Value Ref Range   Scan Result 0ml   I have reviewed the labs.   Pertinent Imaging:  09/25/22 14:33  Scan Result 0ml    Assessment & Plan:    1. High risk hematuria -non smoker -work up x 2 - NED -Tabitha Gonzales reports of gross heme -UA's w/ micro heme -schedule repeat cysto  2. GSM -Tabitha Gonzales symptoms  Return for cysto w/ Dr. Apolinar Junes .  These notes generated with voice recognition software. I apologize for typographical errors.  Cloretta Ned  [facility] [address]  [address] [place], [place] [address] [phone]

## 2022-09-25 ENCOUNTER — Ambulatory Visit: Payer: Medicare HMO | Admitting: Urology

## 2022-09-25 ENCOUNTER — Encounter: Payer: Self-pay | Admitting: Urology

## 2022-09-25 VITALS — BP 122/70 | HR 80 | Wt 175.2 lb

## 2022-09-25 DIAGNOSIS — R319 Hematuria, unspecified: Secondary | ICD-10-CM | POA: Diagnosis not present

## 2022-09-25 DIAGNOSIS — N952 Postmenopausal atrophic vaginitis: Secondary | ICD-10-CM

## 2022-09-25 LAB — URINALYSIS, COMPLETE
Bilirubin, UA: NEGATIVE
Glucose, UA: NEGATIVE
Ketones, UA: NEGATIVE
Nitrite, UA: NEGATIVE
Protein,UA: NEGATIVE
Specific Gravity, UA: 1.015 (ref 1.005–1.030)
Urobilinogen, Ur: 0.2 mg/dL (ref 0.2–1.0)
pH, UA: 6 (ref 5.0–7.5)

## 2022-09-25 LAB — BLADDER SCAN AMB NON-IMAGING

## 2022-09-25 LAB — MICROSCOPIC EXAMINATION

## 2022-09-26 ENCOUNTER — Ambulatory Visit
Admission: RE | Admit: 2022-09-26 | Discharge: 2022-09-26 | Disposition: A | Discharge status: Home / Self Care | Payer: Medicare HMO | Source: Ambulatory Visit | Attending: Internal Medicine | Admitting: Internal Medicine

## 2022-09-26 DIAGNOSIS — R519 Headache, unspecified: Secondary | ICD-10-CM

## 2022-10-23 ENCOUNTER — Encounter: Payer: Self-pay | Admitting: Urology

## 2022-10-23 ENCOUNTER — Ambulatory Visit: Payer: Medicare HMO | Admitting: Urology

## 2022-10-23 VITALS — BP 130/80 | HR 74 | Ht 64.0 in | Wt 175.0 lb

## 2022-10-23 DIAGNOSIS — R3129 Other microscopic hematuria: Secondary | ICD-10-CM | POA: Diagnosis not present

## 2022-10-23 LAB — URINALYSIS, COMPLETE
Bilirubin, UA: NEGATIVE
Glucose, UA: NEGATIVE
Ketones, UA: NEGATIVE
Leukocytes,UA: NEGATIVE
Nitrite, UA: NEGATIVE
Protein,UA: NEGATIVE
Specific Gravity, UA: 1.01 (ref 1.005–1.030)
Urobilinogen, Ur: 0.2 mg/dL (ref 0.2–1.0)
pH, UA: 6 (ref 5.0–7.5)

## 2022-10-23 LAB — MICROSCOPIC EXAMINATION

## 2022-10-23 NOTE — Progress Notes (Signed)
   10/23/22  CC:  Chief Complaint  Patient presents with   Cysto     HPI: Tabitha Gonzales is a 72 y.o.female with a personal history of microscopic hematuria, who presents today for a cystoscopy   Her last cystoscopy in 08/2018 was unremarkable and CT urogram was negative without significant GU pathology.   She underwent a RUS on 09/30/2020 was negative.   She has a family history of bladder tumors.   No urinary issues, occasional UTI.  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    Vitals:   10/23/22 0929  BP: 130/80  Pulse: 74  NED. A&Ox3.   No respiratory distress   Abd soft, NT, ND Normal external genitalia with patent urethral meatus  Cystoscopy Procedure Note  Patient identification was confirmed, informed consent was obtained, and patient was prepped using Betadine solution.  Lidocaine jelly was administered per urethral meatus.    Procedure: - Flexible cystoscope introduced, without any difficulty.   - Thorough search of the bladder revealed:    normal urethral meatus    normal urothelium    no stones    no ulcers     no tumors    no urethral polyps    no trabeculation  - Ureteral orifices were normal in position and appearance.  Post-Procedure: - Patient tolerated the procedure well   Assessment/ Plan:  Microscopic hematuria  - S/P negative work-up 2 years ago - Cystoscopy today was unremarkable with a normal bladder -Discussed risk/ benefits of additional upper tract imaging; elected to defer which is reasonable given longstanding history with this and age -Recommend continuation of cystoscopy every 2 years if she has persistent microscopic blood in her urine or sooner including upper tract imaging if the degree progresses or she develops gross hematuria.  She is agreeable this plan.  She will have Dr. Judithann Sheen check her urine annually and refer her back in 2 years from specifically for cystoscopy if the hematuria persists.   Vanna Scotland,  MD

## 2023-03-19 ENCOUNTER — Other Ambulatory Visit: Payer: Self-pay | Admitting: Internal Medicine

## 2023-03-19 DIAGNOSIS — Z1231 Encounter for screening mammogram for malignant neoplasm of breast: Secondary | ICD-10-CM

## 2023-05-15 ENCOUNTER — Ambulatory Visit
Admission: RE | Admit: 2023-05-15 | Discharge: 2023-05-15 | Disposition: A | Discharge status: Home / Self Care | Source: Ambulatory Visit | Attending: Internal Medicine | Admitting: Internal Medicine

## 2023-05-15 DIAGNOSIS — Z1231 Encounter for screening mammogram for malignant neoplasm of breast: Secondary | ICD-10-CM | POA: Insufficient documentation

## 2024-02-28 ENCOUNTER — Other Ambulatory Visit: Payer: Self-pay | Admitting: Physician Assistant

## 2024-02-28 ENCOUNTER — Ambulatory Visit
Admission: RE | Admit: 2024-02-28 | Discharge: 2024-02-28 | Disposition: A | Source: Ambulatory Visit | Attending: Physician Assistant

## 2024-02-28 DIAGNOSIS — M7989 Other specified soft tissue disorders: Secondary | ICD-10-CM | POA: Insufficient documentation

## 2024-02-28 DIAGNOSIS — M79604 Pain in right leg: Secondary | ICD-10-CM | POA: Insufficient documentation
